# Patient Record
Sex: Female | Born: 1985 | Race: White | Hispanic: No | Marital: Single | Smoking: Former smoker
Health system: Southern US, Community
[De-identification: ages and names within clinical notes are randomized; demographics above are authoritative.]

## PROBLEM LIST (undated history)

## (undated) DIAGNOSIS — F329 Major depressive disorder, single episode, unspecified: Secondary | ICD-10-CM

## (undated) DIAGNOSIS — F32A Depression, unspecified: Secondary | ICD-10-CM

## (undated) DIAGNOSIS — R87629 Unspecified abnormal cytological findings in specimens from vagina: Secondary | ICD-10-CM

## (undated) DIAGNOSIS — I10 Essential (primary) hypertension: Secondary | ICD-10-CM

## (undated) DIAGNOSIS — F419 Anxiety disorder, unspecified: Secondary | ICD-10-CM

## (undated) HISTORY — PX: LEEP: SHX91

## (undated) HISTORY — PX: LASER ABLATION OF THE CERVIX: SHX1949

## (undated) HISTORY — DX: Unspecified abnormal cytological findings in specimens from vagina: R87.629

## (undated) HISTORY — DX: Essential (primary) hypertension: I10

---
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
## 2005-04-20 ENCOUNTER — Other Ambulatory Visit: Admission: RE | Admit: 2005-04-20 | Discharge: 2005-04-20 | Payer: Self-pay | Admitting: Obstetrics and Gynecology

## 2005-10-12 HISTORY — PX: DILATION AND CURETTAGE OF UTERUS: SHX78

## 2005-12-07 ENCOUNTER — Other Ambulatory Visit: Admission: RE | Admit: 2005-12-07 | Discharge: 2005-12-07 | Payer: Self-pay | Admitting: Obstetrics and Gynecology

## 2006-10-26 ENCOUNTER — Inpatient Hospital Stay (HOSPITAL_COMMUNITY): Admission: AD | Admit: 2006-10-26 | Discharge: 2006-10-26 | Payer: Self-pay | Admitting: Obstetrics and Gynecology

## 2006-10-27 ENCOUNTER — Inpatient Hospital Stay (HOSPITAL_COMMUNITY): Admission: AD | Admit: 2006-10-27 | Discharge: 2006-10-27 | Payer: Self-pay | Admitting: Obstetrics and Gynecology

## 2006-10-28 ENCOUNTER — Ambulatory Visit: Payer: Self-pay | Admitting: Obstetrics and Gynecology

## 2006-10-28 ENCOUNTER — Encounter (INDEPENDENT_AMBULATORY_CARE_PROVIDER_SITE_OTHER): Payer: Self-pay | Admitting: *Deleted

## 2006-10-28 ENCOUNTER — Ambulatory Visit (HOSPITAL_COMMUNITY): Admission: RE | Admit: 2006-10-28 | Discharge: 2006-10-28 | Payer: Self-pay | Admitting: Obstetrics and Gynecology

## 2009-12-17 ENCOUNTER — Emergency Department (HOSPITAL_COMMUNITY): Admission: EM | Admit: 2009-12-17 | Discharge: 2009-12-17 | Payer: Self-pay | Admitting: Emergency Medicine

## 2011-02-27 NOTE — Op Note (Signed)
NAME:  Allison Ewing, Allison Ewing               ACCOUNT NO.:  [id number]   MEDICAL RECORD NO.:  [id number]          PATIENT TYPE:  AMB   LOCATION:  SDC                           FACILITY:  [facility]   PHYSICIAN:  Phil D. Okey Dupre, M.D.     DATE OF BIRTH:  [birth year]   DATE OF PROCEDURE:  10/28/2006  DATE OF DISCHARGE:                               OPERATIVE REPORT   PREOPERATIVE DIAGNOSIS:  Missed abortion at approximately 8 weeks'  gestation.   POSTOPERATIVE DIAGNOSIS:  Missed abortion at approximately 8 weeks'  gestation.   PROCEDURE:  Suction, dilatation and evacuation.   SURGEON:  Javier Glazier. Okey Dupre, M.D.   ASSISTANT:  Paticia Stack, M.D.   ANESTHESIA:  General.   SPECIMENS:  Products of conception, sent to Pathology.   ESTIMATED BLOOD LOSS:  Minimal.   COMPLICATIONS:  None.   FINDINGS:  An 8-week-size anteverted uterus.  Moderate amounts of  products of conception.   PROCEDURE:  The patient was taken to the operating room after a missed  Ab was confirmed with ultrasound.  A sterile speculum was placed in the  patient's vagina and the cervix noted to be about 1-cm dilated.  The  cervix and vagina were swabbed with Betadine and a single-tooth  tenaculum was applied to the anterior lip of the cervix.  Ten  milliliters of 1% Xylocaine were injected at the 3 and 9 o'clock  position to produce the paracervical block.  The uterus was then sounded  to 8 cm and serial dilators to 8 Hegar were used.  An 8-mm suction  curette was then advanced gently to the uterine fundus.  The suction  device was then activated and the carriage rotated to clear the uterus  of the products of conception.  Three passes were made and the uterus  was cleared of all products of conception.  There was minimal bleeding  noted and the tenaculum removed with good hemostasis.  The patient  tolerated the procedure well and was taken to Recovery in stable  condition.     ______________________________  Paticia Stack,  MD    ______________________________  Javier Glazier Okey Dupre, M.D.   LNJ/MEDQ  D:  10/28/2006  T:  10/28/2006  Job:  [id number]

## 2011-02-27 NOTE — H&P (Signed)
NAME:  Allison Ewing, TWANNA RESH NO.:  [id number]   MEDICAL RECORD NO.:  [id number]                   PATIENT TYPE:  AMB   LOCATION:  SDC                                  FACILITY:  [facility]   PHYSICIAN:  Juluis Mire, M.D.                DATE OF BIRTH:  07/15/1986   DATE OF ADMISSION:  DATE OF DISCHARGE:                                HISTORY & PHYSICAL   The patient is a 25 year old, nulligravida, single white female who presents  for laser ablation of the cervix.   In relation to the present admission, the patient's last Pap smear did  reveal mild cervical dysplasia.  She underwent colposcopic evaluation.  She  had diffuse acetowhite areas with no __________ and punctation, covering the  active cervix with vaginal extension.  Biopsies were obtained that did  reveal mild cervical dysplasia.  Due to the widespread involvement of the  cervix along the vaginal extension, we are going to proceed with laser  ablation.   ALLERGIES:  In terms of allergies, no known drug allergies.   MEDICATIONS:  Include birth control pills.   PAST MEDICAL HISTORY:  Usual childhood diseases.  No significant sequela.   PAST SURGICAL HISTORY:  She has had no previous surgical or obstetrical  history.   FAMILY HISTORY:  There is a history of diabetes on her mother's side as well  as hypertension and heart disease.   SOCIAL HISTORY:  No tobacco or alcohol use.   REVIEW OF SYSTEMS:  Noncontributory.   PHYSICAL EXAMINATION:  VITAL SIGNS:  The patient is afebrile with stable  vital signs.  HEENT EXAM:  The patient is normocephalic.  Pupils equal, round and reactive  to light and accommodation.  Extraocular movements were intact.  Sclerae and  conjunctivae clear.  Oropharynx clear.  NECK:  Without thyromegaly.  BREASTS:  Not examined.  LUNGS:  Clear.  CARDIOVASCULAR SYSTEM:  Regular rate and rhythm with no murmurs or gallops.  ABDOMINAL EXAM:  Benign.  No masses, organomegaly or  tenderness.  PELVIC:  Normal external genitalia.  Vaginal mucosa is clear.  Cervix  unremarkable to gross examination.  Uterus is normal in size, shape and  contour.  Adnexa are free of masses or tenderness.  EXTREMITIES:  Trace edema.  NEUROLOGIC EXAM:  Grossly within normal limits.   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   PLAN:  The patient is to undergo laser ablation of the cervix.  Success  rates of 90% are quoted.  Potential risks of laser ablation include  hemorrhage that could potentially require the need for transfusion with  associated risk of  AIDS or hepatitis or retreatment.  In terms of future risks, there would be  the risk cervical stenosis that could inhibit the ability to conceive.  Also, risk of cervical incompetency requiring management in future  pregnancy.  The patient expressed  an understanding of the indications, risks  and limitations of the procedure.                                               Juluis Mire, M.D.    JSM/MEDQ  D:  03/28/2003  T:  03/28/2003  Job:  [id number]

## 2011-02-27 NOTE — Op Note (Signed)
   NAME:  Allison Ewing, Allison Ewing                         ACCOUNT NO.:  [id number]   MEDICAL RECORD NO.:  [id number]                   PATIENT TYPE:  AMB   LOCATION:  SDC                                  [facility]ILITY:  [facility]   PHYSICIAN:  Juluis Mire, M.D.                DATE OF BIRTH:  [birth year]   DATE OF PROCEDURE:  03/28/2003  DATE OF DISCHARGE:                                 OPERATIVE REPORT   PREOPERATIVE DIAGNOSIS:  Mild cervical dysplasia with widespread involvement  of the cervix, with vaginal extension.   POSTOPERATIVE DIAGNOSIS:  Mild cervical dysplasia with widespread  involvement of the cervix, with vaginal extension.   PROCEDURE:  Laser ablation of the cervix and vaginal extension of apparent  dysplasia.   SURGEON:  Juluis Mire, M.D.   ANESTHESIA:  General.   ESTIMATED BLOOD LOSS:  Minimal.   PACKS AND DRAINS:  None.   INTRAOPERATIVE BLOOD REPLACED:  None.   COMPLICATIONS:  None.   INDICATIONS:  Indications are dictated in the history and physical.   PROCEDURE:  The patient was taken to the OR and placed in the supine  position.  After a satisfactory level of general anesthesia obtained, the  patient was placed in the dorsal lithotomy position using the Allen  stirrups.  The patient was draped.  A speculum was placed in the vaginal  vault.  The cervix was cleansed with acetic acid.  Colposcopic evaluation  did reveal widespread acetowhite epithelium with abnormal vasculature  consistent with mild dysplasia.  Using the CO2 laser at 20 watts continuous  mode, we ablated the areas of obvious dysplasia to an appropriate depth.  All four quadrants were adequately ablated.  We then ablated the vaginal  extension but much more superficially.  At the end of the procedure all  active areas of cervical dysplasia had been ablated with the CO2 laser to an  adequate depth.  No active bleeding or complications were encountered.  Silvadene cream was placed on the cervix.  The  speculum was then removed.  The patient was taken out of the dorsal lithotomy position and once alert  and extubated transferred to the recovery room in good condition.  Sponge,  instrument, and needle count reported as correct by the circulating nurse.                                                Juluis Mire, M.D.   JSM/MEDQ  D:  03/28/2003  T:  03/28/2003  Job:  [id number]

## 2011-09-17 ENCOUNTER — Encounter: Payer: Self-pay | Admitting: Emergency Medicine

## 2011-09-17 ENCOUNTER — Emergency Department (HOSPITAL_COMMUNITY)
Admission: EM | Admit: 2011-09-17 | Discharge: 2011-09-18 | Disposition: A | Discharge status: Home / Self Care | Payer: Commercial Managed Care - PPO | Attending: Emergency Medicine | Admitting: Emergency Medicine

## 2011-09-17 DIAGNOSIS — R42 Dizziness and giddiness: Secondary | ICD-10-CM | POA: Insufficient documentation

## 2011-09-17 DIAGNOSIS — R002 Palpitations: Secondary | ICD-10-CM | POA: Insufficient documentation

## 2011-09-17 DIAGNOSIS — F419 Anxiety disorder, unspecified: Secondary | ICD-10-CM

## 2011-09-17 DIAGNOSIS — F411 Generalized anxiety disorder: Secondary | ICD-10-CM | POA: Insufficient documentation

## 2011-09-17 DIAGNOSIS — R0602 Shortness of breath: Secondary | ICD-10-CM | POA: Insufficient documentation

## 2011-09-17 HISTORY — DX: Anxiety disorder, unspecified: F41.9

## 2011-09-17 NOTE — ED Notes (Signed)
Pt alert, nad, c/o anxiety, recently seen and treated, started on Lexapro, resp even unlabored, skin pwd, denies SI/HI

## 2011-09-18 NOTE — ED Provider Notes (Signed)
Medical screening examination/treatment/procedure(s) were performed by non-physician practitioner and as supervising physician I was immediately available for consultation/collaboration.  Gervase Colberg K Hargis Vandyne-Rasch, MD 09/18/11 2359 

## 2011-09-18 NOTE — ED Notes (Signed)
Pt with c/o heart palpitations, dizziness, headaches, SOB, near syncope, weakness, nausea, disorientation, and diaphoresis since 2000. Pt states that she took Lexapro today around 1600 for the first time as it was prescribed by her doctor for anxiety. Pt states the Lexapro didn't help her symptoms. It made them worse. Pt in no distress at this time. Respirations even and unlabored.

## 2011-09-18 NOTE — ED Provider Notes (Signed)
History     CSN: [id number] Arrival date & time: 09/17/2011 11:13 PM   First MD Initiated Contact with Patient 09/18/11 0200      Chief Complaint  Patient presents with  . Anxiety    HPI  History provided to patient and mother. Patient presents with complaints of "possible panic attack" earlier this evening. She states that she has hx of some anxiety and "palpitations" for which she was just recently Rx Lexapro by her PCP. Patient took her first dose last evening around 6 to 7 PM.  Shortly after that time she began to have worsening symptoms of heart palpitations, rapid breathing, lightheadedness, and feeling "jittery".  Symptoms lasted about 3-4 hours and then slowly began to improve.  Pt states she was worried she may be having a reaction to the Lexapro.  She denies having rash, pruritis, throat to neck swelling, nausea or vomiting.  Pt has no other significant PMH.     Past Medical History  Diagnosis Date  . Anxiety     History reviewed. No pertinent past surgical history.  No family history on file.  History  Substance Use Topics  . Smoking status: Current Everyday Smoker -- 1.0 packs/day  . Smokeless tobacco: Not on file  . Alcohol Use: Yes    OB History    Grav Para Term Preterm Abortions TAB SAB Ect Mult Living                  Review of Systems  Constitutional: Positive for diaphoresis. Negative for fever and chills.  Respiratory: Positive for shortness of breath. Negative for cough and wheezing.   Cardiovascular: Positive for palpitations. Negative for chest pain.  Gastrointestinal: Negative for nausea, vomiting, abdominal pain, diarrhea and constipation.  Neurological: Positive for light-headedness. Negative for weakness.  Psychiatric/Behavioral: Negative for confusion.  All other systems reviewed and are negative.    Allergies  Review of patient's allergies indicates no known allergies.  Home Medications   Current Outpatient Rx  Name Route Sig  Dispense Refill  . ESCITALOPRAM OXALATE 10 MG PO TABS Oral Take 10 mg by mouth daily.      Marland Kitchen FLINTSTONES COMPLETE PO Oral Take 1 tablet by mouth daily.        BP 125/76  Pulse 82  Temp 98 F (36.7 C)  Resp 16  Wt 110 lb (49.896 kg)  SpO2 100%  LMP 08/25/2011  Physical Exam  Nursing note and vitals reviewed. Constitutional: She is oriented to person, place, and time. She appears well-developed and well-nourished. No distress.  HENT:  Head: Normocephalic.  Mouth/Throat: Oropharynx is clear and moist.  Eyes: Conjunctivae and EOM are normal. Pupils are equal, round, and reactive to light.  Neck: Normal range of motion. Neck supple.  Cardiovascular: Normal rate, regular rhythm and normal heart sounds.   Pulmonary/Chest: Effort normal and breath sounds normal. No respiratory distress. She has no wheezes. She has no rales.  Abdominal: Soft.  Musculoskeletal: She exhibits no edema and no tenderness.  Neurological: She is alert and oriented to person, place, and time. No cranial nerve deficit.  Skin: Skin is warm. No rash noted.  Psychiatric: She has a normal mood and affect. Her behavior is normal.    ED Course  Procedures (including critical care time)   1. Anxiety      MDM  Pt seen and evaluated. Patient in no acute distress. Patient with relief of symptoms at this time.        Phill Mutter  Garron Eline, [place] 09/18/11 [phone]

## 2013-12-28 LAB — OB RESULTS CONSOLE ANTIBODY SCREEN: ANTIBODY SCREEN: NEGATIVE

## 2013-12-28 LAB — OB RESULTS CONSOLE RPR: RPR: NONREACTIVE

## 2013-12-28 LAB — OB RESULTS CONSOLE RUBELLA ANTIBODY, IGM: RUBELLA: NON-IMMUNE/NOT IMMUNE

## 2013-12-28 LAB — OB RESULTS CONSOLE HIV ANTIBODY (ROUTINE TESTING): HIV: NONREACTIVE

## 2013-12-28 LAB — OB RESULTS CONSOLE ABO/RH: RH TYPE: POSITIVE

## 2013-12-28 LAB — OB RESULTS CONSOLE HEPATITIS B SURFACE ANTIGEN: HEP B S AG: NEGATIVE

## 2014-07-31 ENCOUNTER — Telehealth (HOSPITAL_COMMUNITY): Payer: Self-pay | Admitting: *Deleted

## 2014-07-31 ENCOUNTER — Encounter (HOSPITAL_COMMUNITY): Payer: Self-pay | Admitting: *Deleted

## 2014-07-31 LAB — OB RESULTS CONSOLE GBS: GBS: NEGATIVE

## 2014-07-31 NOTE — Telephone Encounter (Signed)
Preadmission screen  

## 2014-08-05 ENCOUNTER — Inpatient Hospital Stay (HOSPITAL_COMMUNITY): Payer: PRIVATE HEALTH INSURANCE | Admitting: Anesthesiology

## 2014-08-05 ENCOUNTER — Encounter (HOSPITAL_COMMUNITY): Payer: Self-pay | Admitting: *Deleted

## 2014-08-05 ENCOUNTER — Encounter (HOSPITAL_COMMUNITY): Payer: PRIVATE HEALTH INSURANCE | Admitting: Anesthesiology

## 2014-08-05 ENCOUNTER — Inpatient Hospital Stay (HOSPITAL_COMMUNITY)
Admission: AD | Admit: 2014-08-05 | Discharge: 2014-08-08 | DRG: 766 | Disposition: A | Discharge status: Home / Self Care | Payer: PRIVATE HEALTH INSURANCE | Source: Ambulatory Visit | Attending: Obstetrics & Gynecology | Admitting: Obstetrics & Gynecology

## 2014-08-05 ENCOUNTER — Inpatient Hospital Stay (HOSPITAL_COMMUNITY)
Admission: AD | Admit: 2014-08-05 | Discharge: 2014-08-05 | Disposition: A | Discharge status: Home / Self Care | Payer: PRIVATE HEALTH INSURANCE | Source: Ambulatory Visit | Attending: Obstetrics & Gynecology | Admitting: Obstetrics & Gynecology

## 2014-08-05 DIAGNOSIS — O471 False labor at or after 37 completed weeks of gestation: Secondary | ICD-10-CM | POA: Insufficient documentation

## 2014-08-05 DIAGNOSIS — F419 Anxiety disorder, unspecified: Secondary | ICD-10-CM | POA: Diagnosis present

## 2014-08-05 DIAGNOSIS — O480 Post-term pregnancy: Secondary | ICD-10-CM | POA: Diagnosis present

## 2014-08-05 DIAGNOSIS — Z87891 Personal history of nicotine dependence: Secondary | ICD-10-CM

## 2014-08-05 DIAGNOSIS — O99344 Other mental disorders complicating childbirth: Secondary | ICD-10-CM | POA: Diagnosis present

## 2014-08-05 DIAGNOSIS — Z833 Family history of diabetes mellitus: Secondary | ICD-10-CM

## 2014-08-05 DIAGNOSIS — Z823 Family history of stroke: Secondary | ICD-10-CM

## 2014-08-05 DIAGNOSIS — Z3A41 41 weeks gestation of pregnancy: Secondary | ICD-10-CM | POA: Diagnosis present

## 2014-08-05 DIAGNOSIS — Z3A40 40 weeks gestation of pregnancy: Secondary | ICD-10-CM | POA: Diagnosis not present

## 2014-08-05 DIAGNOSIS — IMO0001 Reserved for inherently not codable concepts without codable children: Secondary | ICD-10-CM

## 2014-08-05 DIAGNOSIS — Z8249 Family history of ischemic heart disease and other diseases of the circulatory system: Secondary | ICD-10-CM

## 2014-08-05 DIAGNOSIS — Z98891 History of uterine scar from previous surgery: Secondary | ICD-10-CM

## 2014-08-05 LAB — TYPE AND SCREEN
ABO/RH(D): A POS
Antibody Screen: NEGATIVE

## 2014-08-05 LAB — CBC
HCT: 32 % — ABNORMAL LOW (ref 36.0–46.0)
Hemoglobin: 10.9 g/dL — ABNORMAL LOW (ref 12.0–15.0)
MCH: 31.4 pg (ref 26.0–34.0)
MCHC: 34.1 g/dL (ref 30.0–36.0)
MCV: 92.2 fL (ref 78.0–100.0)
Platelets: 177 10*3/uL (ref 150–400)
RBC: 3.47 MIL/uL — ABNORMAL LOW (ref 3.87–5.11)
RDW: 13.8 % (ref 11.5–15.5)
WBC: 9.9 10*3/uL (ref 4.0–10.5)

## 2014-08-05 LAB — RPR

## 2014-08-05 LAB — OB RESULTS CONSOLE GC/CHLAMYDIA
Chlamydia: NEGATIVE
Gonorrhea: NEGATIVE

## 2014-08-05 MED ORDER — LIDOCAINE HCL (PF) 1 % IJ SOLN: 30.0000 mL | INTRAMUSCULAR | Status: DC | PRN

## 2014-08-05 MED ORDER — OXYCODONE-ACETAMINOPHEN 5-325 MG PO TABS: 1.0000 | ORAL_TABLET | ORAL | Status: DC | PRN

## 2014-08-05 MED ORDER — FENTANYL 2.5 MCG/ML BUPIVACAINE 1/10 % EPIDURAL INFUSION (WH - ANES)
INTRAMUSCULAR | Status: AC
  Administered 2014-08-05: 14 mL/h via EPIDURAL
  Filled 2014-08-05: qty 125

## 2014-08-05 MED ORDER — EPHEDRINE 5 MG/ML INJ: 10.0000 mg | INTRAVENOUS | Status: DC | PRN

## 2014-08-05 MED ORDER — OXYTOCIN BOLUS FROM INFUSION: 500.0000 mL | INTRAVENOUS | Status: DC

## 2014-08-05 MED ORDER — ONDANSETRON HCL 4 MG/2ML IJ SOLN: 4.0000 mg | Freq: Four times a day (QID) | INTRAMUSCULAR | Status: DC | PRN

## 2014-08-05 MED ORDER — TERBUTALINE SULFATE 1 MG/ML IJ SOLN: 0.2500 mg | Freq: Once | INTRAMUSCULAR | Status: AC | PRN

## 2014-08-05 MED ORDER — OXYCODONE-ACETAMINOPHEN 5-325 MG PO TABS: 2.0000 | ORAL_TABLET | ORAL | Status: DC | PRN

## 2014-08-05 MED ORDER — PHENYLEPHRINE 40 MCG/ML (10ML) SYRINGE FOR IV PUSH (FOR BLOOD PRESSURE SUPPORT)
PREFILLED_SYRINGE | INTRAVENOUS | Status: AC
  Filled 2014-08-05: qty 10

## 2014-08-05 MED ORDER — OXYTOCIN 40 UNITS IN LACTATED RINGERS INFUSION - SIMPLE MED: 62.5000 mL/h | INTRAVENOUS | Status: DC

## 2014-08-05 MED ORDER — CITRIC ACID-SODIUM CITRATE 334-500 MG/5ML PO SOLN
30.0000 mL | ORAL | Status: DC | PRN
  Administered 2014-08-06: 30 mL via ORAL
  Filled 2014-08-05 (×2): qty 15

## 2014-08-05 MED ORDER — FENTANYL 2.5 MCG/ML BUPIVACAINE 1/10 % EPIDURAL INFUSION (WH - ANES)
14.0000 mL/h | INTRAMUSCULAR | Status: DC | PRN
  Administered 2014-08-05 (×2): 14 mL/h via EPIDURAL
  Filled 2014-08-05: qty 125

## 2014-08-05 MED ORDER — ACETAMINOPHEN 325 MG PO TABS: 650.0000 mg | ORAL_TABLET | ORAL | Status: DC | PRN

## 2014-08-05 MED ORDER — DIPHENHYDRAMINE HCL 50 MG/ML IJ SOLN: 12.5000 mg | INTRAMUSCULAR | Status: DC | PRN

## 2014-08-05 MED ORDER — PHENYLEPHRINE 40 MCG/ML (10ML) SYRINGE FOR IV PUSH (FOR BLOOD PRESSURE SUPPORT): 80.0000 ug | PREFILLED_SYRINGE | INTRAVENOUS | Status: DC | PRN

## 2014-08-05 MED ORDER — LACTATED RINGERS IV SOLN: 500.0000 mL | Freq: Once | INTRAVENOUS | Status: DC

## 2014-08-05 MED ORDER — LACTATED RINGERS IV SOLN: 500.0000 mL | INTRAVENOUS | Status: DC | PRN

## 2014-08-05 MED ORDER — OXYTOCIN 40 UNITS IN LACTATED RINGERS INFUSION - SIMPLE MED
1.0000 m[IU]/min | INTRAVENOUS | Status: DC
  Administered 2014-08-05: 2 m[IU]/min via INTRAVENOUS
  Filled 2014-08-05: qty 1000

## 2014-08-05 MED ORDER — LACTATED RINGERS IV SOLN
INTRAVENOUS | Status: DC
  Administered 2014-08-05 (×2): via INTRAVENOUS

## 2014-08-05 MED ORDER — FLEET ENEMA 7-19 GM/118ML RE ENEM: 1.0000 | ENEMA | RECTAL | Status: DC | PRN

## 2014-08-05 MED ORDER — PHENYLEPHRINE 40 MCG/ML (10ML) SYRINGE FOR IV PUSH (FOR BLOOD PRESSURE SUPPORT)
80.0000 ug | PREFILLED_SYRINGE | INTRAVENOUS | Status: DC | PRN
  Administered 2014-08-06: 80 ug via INTRAVENOUS

## 2014-08-05 MED ORDER — LIDOCAINE HCL (PF) 1 % IJ SOLN
INTRAMUSCULAR | Status: DC | PRN
  Administered 2014-08-05 – 2014-08-06 (×4): 5 mL

## 2014-08-05 NOTE — Progress Notes (Signed)
PT sitting on bedside 

## 2014-08-05 NOTE — MAU Note (Signed)
C/o ics since 0330 this AM; denies SROM;

## 2014-08-05 NOTE — H&P (Signed)
Allison Judie PetitM Uvaldo RisingMcNeil is a 28 y.o. female presenting for labor.  She has been contracting for greater than 24 hours with increasing intensity.  No LOF, VB.  Active FM.  Antepartum course uncomplicated except by anxiety.  GBS negative.  Comfortable with epidural.  Maternal Medical History:  Reason for admission: Contractions.   Contractions: Onset was 13-24 hours ago.   Frequency: regular.   Perceived severity is moderate.    Fetal activity: Perceived fetal activity is normal.   Last perceived fetal movement was within the past hour.    Prenatal complications: no prenatal complications Prenatal Complications - Diabetes: none.    OB History   Grav Para Term Preterm Abortions TAB SAB Ect Mult Living   2 0   1  1        Past Medical History  Diagnosis Date  . Anxiety   . Vaginal Pap smear, abnormal    Past Surgical History  Procedure Laterality Date  . Leep    . Laser ablation of the cervix     Family History: family history includes Cancer in her maternal grandfather and paternal grandmother; Diabetes in her maternal aunt; Hypertension in her maternal grandmother and paternal grandmother; Stroke in her maternal grandmother. Social History:  reports that she has quit smoking. Her smoking use included Cigarettes. She smoked 1.00 pack per day. She has never used smokeless tobacco. She reports that she drinks alcohol. She reports that she does not use illicit drugs.   Prenatal Transfer Tool  Maternal Diabetes: No Genetic Screening: Normal Maternal Ultrasounds/Referrals: Normal Fetal Ultrasounds or other Referrals:  None Maternal Substance Abuse:  No Significant Maternal Medications:  None Significant Maternal Lab Results:  Lab values include: Group B Strep negative Other Comments:  None  ROS  Dilation: 5 Effacement (%): 100 Station: -3 Exam by:: Elana AlmElizabeth [facility] Blood pressure 136/80, pulse 101, temperature 97.7 F (36.5 C), temperature source Axillary, resp. rate 20, height  5' 3.5" (1.613 m), weight 157 lb (71.215 kg), last menstrual period 10/23/2013, SpO2 99.00%. Maternal Exam:  Uterine Assessment: Contraction strength is moderate.  Contraction frequency is regular.   Abdomen: Patient reports no abdominal tenderness. Fundal height is c/w dates.   Estimated fetal weight is 8#.       Physical Exam  Constitutional: She is oriented to person, place, and time. She appears well-developed and well-nourished.  GI: Soft. There is no rebound and no guarding.  Neurological: She is alert and oriented to person, place, and time.  Skin: Skin is warm and dry.  Psychiatric: She has a normal mood and affect. Her behavior is normal.    Prenatal labs: ABO, Rh: --/--/A POS (10/25 1432) Antibody: NEG (10/25 1432) Rubella: Nonimmune (03/19 0000) RPR: Nonreactive (03/19 0000)  HBsAg: Negative (03/19 0000)  HIV: Non-reactive (03/19 0000)  GBS: Negative (10/20 0000)   Assessment/Plan: 28yo G2P0010 at 9430w6d with labor -Expect SVD    Ermon Sagan 08/05/2014, 6:14 PM

## 2014-08-05 NOTE — Discharge Instructions (Signed)
Braxton Hicks Contractions °Contractions of the uterus can occur throughout pregnancy. Contractions are not always a sign that you are in labor.  °WHAT ARE BRAXTON HICKS CONTRACTIONS?  °Contractions that occur before labor are called Braxton Hicks contractions, or false labor. Toward the end of pregnancy (32-34 weeks), these contractions can develop more often and may become more forceful. This is not true labor because these contractions do not result in opening (dilatation) and thinning of the cervix. They are sometimes difficult to tell apart from true labor because these contractions can be forceful and people have different pain tolerances. You should not feel embarrassed if you go to the hospital with false labor. Sometimes, the only way to tell if you are in true labor is for your health care provider to look for changes in the cervix. °If there are no prenatal problems or other health problems associated with the pregnancy, it is completely safe to be sent home with false labor and await the onset of true labor. °HOW CAN YOU TELL THE DIFFERENCE BETWEEN TRUE AND FALSE LABOR? °False Labor °· The contractions of false labor are usually shorter and not as hard as those of true labor.   °· The contractions are usually irregular.   °· The contractions are often felt in the front of the lower abdomen and in the groin.   °· The contractions may go away when you walk around or change positions while lying down.   °· The contractions get weaker and are shorter lasting as time goes on.   °· The contractions do not usually become progressively stronger, regular, and closer together as with true labor.   °True Labor °· Contractions in true labor last 30-70 seconds, become very regular, usually become more intense, and increase in frequency.   °· The contractions do not go away with walking.   °· The discomfort is usually felt in the top of the uterus and spreads to the lower abdomen and low back.   °· True labor can be  determined by your health care provider with an exam. This will show that the cervix is dilating and getting thinner.   °WHAT TO REMEMBER °· Keep up with your usual exercises and follow other instructions given by your health care provider.   °· Take medicines as directed by your health care provider.   °· Keep your regular prenatal appointments.   °· Eat and drink lightly if you think you are going into labor.   °· If Braxton Hicks contractions are making you uncomfortable:   °¨ Change your position from lying down or resting to walking, or from walking to resting.   °¨ Sit and rest in a tub of warm water.   °¨ Drink 2-3 glasses of water. Dehydration may cause these contractions.   °¨ Do slow and deep breathing several times an hour.   °WHEN SHOULD I SEEK IMMEDIATE MEDICAL CARE? °Seek immediate medical care if: °· Your contractions become stronger, more regular, and closer together.   °· You have fluid leaking or gushing from your vagina.   °· You have a fever.   °· You pass blood-tinged mucus.   °· You have vaginal bleeding.   °· You have continuous abdominal pain.   °· You have low back pain that you never had before.   °· You feel your baby's head pushing down and causing pelvic pressure.   °· Your baby is not moving as much as it used to.   °Document Released: 09/28/2005 Document Revised: 10/03/2013 Document Reviewed: 07/10/2013 °[employer]® Patient Information ©2015 [employer]. This information is not intended to replace advice given to you by your health care   provider. Make sure you discuss any questions you have with your health care provider. ° °

## 2014-08-05 NOTE — MAU Note (Signed)
Contractions since 0330. Some bloody show. Denies LOF

## 2014-08-05 NOTE — Anesthesia Preprocedure Evaluation (Signed)

## 2014-08-05 NOTE — Progress Notes (Signed)
Dr Langston MaskerMorris notified of pt's admission and status. Aware of ctx pattern, sve, unable to feel pp. Pt scheduled for induction 1930 on Monday.

## 2014-08-05 NOTE — Anesthesia Procedure Notes (Signed)
Epidural Patient location during procedure: OB Start time: 08/05/2014 3:42 PM  Staffing Anesthesiologist: Brayton CavesJACKSON, Ranald Alessio Performed by: anesthesiologist   Preanesthetic Checklist Completed: patient identified, site marked, surgical consent, pre-op evaluation, timeout performed, IV checked, risks and benefits discussed and monitors and equipment checked  Epidural Patient position: sitting Prep: site prepped and draped and DuraPrep Patient monitoring: continuous pulse ox and blood pressure Approach: midline Location: L3-L4 Injection technique: LOR air  Needle:  Needle type: Tuohy  Needle gauge: 17 G Needle length: 9 cm and 9 Needle insertion depth: 5 cm cm Catheter type: closed end flexible Catheter size: 19 Gauge Catheter at skin depth: 10 cm Test dose: negative  Assessment Events: blood not aspirated, injection not painful, no injection resistance, negative IV test and no paresthesia  Additional Notes Patient identified.  Risk benefits discussed including failed block, incomplete pain control, headache, nerve damage, paralysis, blood pressure changes, nausea, vomiting, reactions to medication both toxic or allergic, and postpartum back pain.  Patient expressed understanding and wished to proceed.  All questions were answered.  Sterile technique used throughout procedure and epidural site dressed with sterile barrier dressing. No paresthesia or other complications noted.The patient did not experience any signs of intravascular injection such as tinnitus or metallic taste in mouth nor signs of intrathecal spread such as rapid motor block. Please see nursing notes for vital signs.

## 2014-08-05 NOTE — Progress Notes (Signed)
Written and verbal d/c instructions given for labor precautions and understanding voiced.

## 2014-08-06 ENCOUNTER — Encounter (HOSPITAL_COMMUNITY): Payer: Self-pay | Admitting: *Deleted

## 2014-08-06 ENCOUNTER — Encounter (HOSPITAL_COMMUNITY)
Admission: AD | Disposition: A | Discharge status: Home / Self Care | Payer: Self-pay | Source: Ambulatory Visit | Attending: Obstetrics & Gynecology

## 2014-08-06 ENCOUNTER — Inpatient Hospital Stay (HOSPITAL_COMMUNITY): Admission: RE | Admit: 2014-08-06 | Payer: Commercial Managed Care - PPO | Source: Ambulatory Visit

## 2014-08-06 DIAGNOSIS — Z98891 History of uterine scar from previous surgery: Secondary | ICD-10-CM

## 2014-08-06 SURGERY — Surgical Case
Anesthesia: Epidural | Site: Abdomen

## 2014-08-06 MED ORDER — SERTRALINE HCL 50 MG PO TABS
50.0000 mg | ORAL_TABLET | Freq: Every day | ORAL | Status: DC
  Administered 2014-08-06 – 2014-08-07 (×2): 50 mg via ORAL
  Filled 2014-08-06 (×4): qty 1

## 2014-08-06 MED ORDER — LANOLIN HYDROUS EX OINT: 1.0000 "application " | TOPICAL_OINTMENT | CUTANEOUS | Status: DC | PRN

## 2014-08-06 MED ORDER — TETANUS-DIPHTH-ACELL PERTUSSIS 5-2.5-18.5 LF-MCG/0.5 IM SUSP: 0.5000 mL | Freq: Once | INTRAMUSCULAR | Status: DC

## 2014-08-06 MED ORDER — PRENATAL MULTIVITAMIN CH
1.0000 | ORAL_TABLET | Freq: Every day | ORAL | Status: DC
  Administered 2014-08-06 – 2014-08-08 (×3): 1 via ORAL
  Filled 2014-08-06 (×2): qty 1

## 2014-08-06 MED ORDER — SCOPOLAMINE 1 MG/3DAYS TD PT72
1.0000 | MEDICATED_PATCH | Freq: Once | TRANSDERMAL | Status: DC
  Filled 2014-08-06: qty 1

## 2014-08-06 MED ORDER — SCOPOLAMINE 1 MG/3DAYS TD PT72
MEDICATED_PATCH | TRANSDERMAL | Status: DC | PRN
  Administered 2014-08-06: 1 via TRANSDERMAL

## 2014-08-06 MED ORDER — WITCH HAZEL-GLYCERIN EX PADS: 1.0000 "application " | MEDICATED_PAD | CUTANEOUS | Status: DC | PRN

## 2014-08-06 MED ORDER — MORPHINE SULFATE 0.5 MG/ML IJ SOLN
INTRAMUSCULAR | Status: AC
  Filled 2014-08-06: qty 10

## 2014-08-06 MED ORDER — DIPHENHYDRAMINE HCL 25 MG PO CAPS: 25.0000 mg | ORAL_CAPSULE | Freq: Four times a day (QID) | ORAL | Status: DC | PRN

## 2014-08-06 MED ORDER — KETOROLAC TROMETHAMINE 30 MG/ML IJ SOLN: 30.0000 mg | Freq: Four times a day (QID) | INTRAMUSCULAR | Status: DC | PRN

## 2014-08-06 MED ORDER — KETOROLAC TROMETHAMINE 30 MG/ML IJ SOLN
30.0000 mg | Freq: Four times a day (QID) | INTRAMUSCULAR | Status: DC | PRN
  Administered 2014-08-06: 30 mg via INTRAMUSCULAR

## 2014-08-06 MED ORDER — LACTATED RINGERS IV SOLN
INTRAVENOUS | Status: DC | PRN
  Administered 2014-08-06: 05:00:00 via INTRAVENOUS

## 2014-08-06 MED ORDER — MEASLES, MUMPS & RUBELLA VAC ~~LOC~~ INJ
0.5000 mL | INJECTION | Freq: Once | SUBCUTANEOUS | Status: DC
  Filled 2014-08-06: qty 0.5

## 2014-08-06 MED ORDER — ONDANSETRON HCL 4 MG/2ML IJ SOLN: 4.0000 mg | Freq: Three times a day (TID) | INTRAMUSCULAR | Status: DC | PRN

## 2014-08-06 MED ORDER — ONDANSETRON HCL 4 MG PO TABS: 4.0000 mg | ORAL_TABLET | ORAL | Status: DC | PRN

## 2014-08-06 MED ORDER — ONDANSETRON HCL 4 MG/2ML IJ SOLN: 4.0000 mg | INTRAMUSCULAR | Status: DC | PRN

## 2014-08-06 MED ORDER — DIPHENHYDRAMINE HCL 25 MG PO CAPS: 25.0000 mg | ORAL_CAPSULE | ORAL | Status: DC | PRN

## 2014-08-06 MED ORDER — FENTANYL CITRATE 0.05 MG/ML IJ SOLN
25.0000 ug | INTRAMUSCULAR | Status: DC | PRN
  Administered 2014-08-06: 25 ug via INTRAVENOUS

## 2014-08-06 MED ORDER — NALBUPHINE HCL 10 MG/ML IJ SOLN
5.0000 mg | INTRAMUSCULAR | Status: DC | PRN
  Administered 2014-08-06 (×2): via SUBCUTANEOUS
  Filled 2014-08-06 (×2): qty 1

## 2014-08-06 MED ORDER — MEPERIDINE HCL 25 MG/ML IJ SOLN: 6.2500 mg | INTRAMUSCULAR | Status: DC | PRN

## 2014-08-06 MED ORDER — SIMETHICONE 80 MG PO CHEW
80.0000 mg | CHEWABLE_TABLET | ORAL | Status: DC
  Administered 2014-08-07 – 2014-08-08 (×2): 80 mg via ORAL
  Filled 2014-08-06 (×2): qty 1

## 2014-08-06 MED ORDER — NALBUPHINE HCL 10 MG/ML IJ SOLN
5.0000 mg | Freq: Once | INTRAMUSCULAR | Status: AC | PRN
  Administered 2014-08-06: 5 mg via SUBCUTANEOUS

## 2014-08-06 MED ORDER — NALBUPHINE HCL 10 MG/ML IJ SOLN: 5.0000 mg | INTRAMUSCULAR | Status: DC | PRN

## 2014-08-06 MED ORDER — NALBUPHINE HCL 10 MG/ML IJ SOLN: 5.0000 mg | Freq: Once | INTRAMUSCULAR | Status: AC | PRN

## 2014-08-06 MED ORDER — MEPERIDINE HCL 25 MG/ML IJ SOLN
INTRAMUSCULAR | Status: AC
  Filled 2014-08-06: qty 1

## 2014-08-06 MED ORDER — SENNOSIDES-DOCUSATE SODIUM 8.6-50 MG PO TABS
2.0000 | ORAL_TABLET | ORAL | Status: DC
  Administered 2014-08-07 – 2014-08-08 (×2): 2 via ORAL
  Filled 2014-08-06 (×2): qty 2

## 2014-08-06 MED ORDER — DIPHENHYDRAMINE HCL 50 MG/ML IJ SOLN
INTRAMUSCULAR | Status: AC
  Filled 2014-08-06: qty 1

## 2014-08-06 MED ORDER — KETOROLAC TROMETHAMINE 30 MG/ML IJ SOLN
INTRAMUSCULAR | Status: AC
  Administered 2014-08-06: 30 mg via INTRAMUSCULAR
  Filled 2014-08-06: qty 1

## 2014-08-06 MED ORDER — KETOROLAC TROMETHAMINE 30 MG/ML IJ SOLN: 30.0000 mg | Freq: Four times a day (QID) | INTRAMUSCULAR | Status: AC | PRN

## 2014-08-06 MED ORDER — LACTATED RINGERS IV SOLN: INTRAVENOUS | Status: DC

## 2014-08-06 MED ORDER — MORPHINE SULFATE (PF) 0.5 MG/ML IJ SOLN
INTRAMUSCULAR | Status: DC | PRN
  Administered 2014-08-06: .5 mg via EPIDURAL
  Administered 2014-08-06: .5 mg via INTRAVENOUS

## 2014-08-06 MED ORDER — ONDANSETRON HCL 4 MG/2ML IJ SOLN
INTRAMUSCULAR | Status: DC | PRN
  Administered 2014-08-06: 4 mg via INTRAVENOUS

## 2014-08-06 MED ORDER — DIPHENHYDRAMINE HCL 50 MG/ML IJ SOLN: 12.5000 mg | INTRAMUSCULAR | Status: DC | PRN

## 2014-08-06 MED ORDER — MEPERIDINE HCL 25 MG/ML IJ SOLN
INTRAMUSCULAR | Status: DC | PRN
  Administered 2014-08-06 (×2): 12.5 mg via INTRAVENOUS

## 2014-08-06 MED ORDER — PHENYLEPHRINE 40 MCG/ML (10ML) SYRINGE FOR IV PUSH (FOR BLOOD PRESSURE SUPPORT)
PREFILLED_SYRINGE | INTRAVENOUS | Status: AC
  Filled 2014-08-06: qty 5

## 2014-08-06 MED ORDER — OXYCODONE-ACETAMINOPHEN 5-325 MG PO TABS
1.0000 | ORAL_TABLET | ORAL | Status: DC | PRN
Start: 2014-08-06 — End: 2014-08-08
  Administered 2014-08-07 – 2014-08-08 (×3): 1 via ORAL
  Filled 2014-08-06 (×3): qty 1

## 2014-08-06 MED ORDER — NALOXONE HCL 0.4 MG/ML IJ SOLN: 0.4000 mg | INTRAMUSCULAR | Status: DC | PRN

## 2014-08-06 MED ORDER — OXYTOCIN 40 UNITS IN LACTATED RINGERS INFUSION - SIMPLE MED
62.5000 mL/h | INTRAVENOUS | Status: AC
  Administered 2014-08-06: 62.5 mL/h via INTRAVENOUS

## 2014-08-06 MED ORDER — NALBUPHINE HCL 10 MG/ML IJ SOLN: 5.0000 mg | Freq: Once | INTRAMUSCULAR | Status: DC | PRN

## 2014-08-06 MED ORDER — SODIUM CHLORIDE 0.9 % IJ SOLN: 3.0000 mL | INTRAMUSCULAR | Status: DC | PRN

## 2014-08-06 MED ORDER — OXYTOCIN 10 UNIT/ML IJ SOLN
40.0000 [IU] | INTRAVENOUS | Status: DC | PRN
  Administered 2014-08-06: 40 [IU] via INTRAVENOUS

## 2014-08-06 MED ORDER — DIPHENHYDRAMINE HCL 50 MG/ML IJ SOLN
INTRAMUSCULAR | Status: DC | PRN
  Administered 2014-08-06: 25 mg via INTRAVENOUS

## 2014-08-06 MED ORDER — MENTHOL 3 MG MT LOZG: 1.0000 | LOZENGE | OROMUCOSAL | Status: DC | PRN

## 2014-08-06 MED ORDER — MORPHINE SULFATE (PF) 0.5 MG/ML IJ SOLN
INTRAMUSCULAR | Status: DC | PRN
  Administered 2014-08-06: 4 mg via EPIDURAL

## 2014-08-06 MED ORDER — OXYCODONE-ACETAMINOPHEN 5-325 MG PO TABS
2.0000 | ORAL_TABLET | ORAL | Status: DC | PRN
  Administered 2014-08-06: 2 via ORAL
  Filled 2014-08-06: qty 2

## 2014-08-06 MED ORDER — DIBUCAINE 1 % RE OINT: 1.0000 "application " | TOPICAL_OINTMENT | RECTAL | Status: DC | PRN

## 2014-08-06 MED ORDER — OXYTOCIN 40 UNITS IN LACTATED RINGERS INFUSION - SIMPLE MED
INTRAVENOUS | Status: AC
  Filled 2014-08-06: qty 1000

## 2014-08-06 MED ORDER — CEFAZOLIN SODIUM-DEXTROSE 2-3 GM-% IV SOLR
INTRAVENOUS | Status: DC | PRN
  Administered 2014-08-06: 2 g via INTRAVENOUS

## 2014-08-06 MED ORDER — ONDANSETRON HCL 4 MG/2ML IJ SOLN
INTRAMUSCULAR | Status: AC
  Filled 2014-08-06: qty 2

## 2014-08-06 MED ORDER — FENTANYL CITRATE 0.05 MG/ML IJ SOLN
INTRAMUSCULAR | Status: AC
  Filled 2014-08-06: qty 2

## 2014-08-06 MED ORDER — ZOLPIDEM TARTRATE 5 MG PO TABS: 5.0000 mg | ORAL_TABLET | Freq: Every evening | ORAL | Status: DC | PRN

## 2014-08-06 MED ORDER — IBUPROFEN 600 MG PO TABS
600.0000 mg | ORAL_TABLET | Freq: Four times a day (QID) | ORAL | Status: DC
  Administered 2014-08-06 – 2014-08-08 (×8): 600 mg via ORAL
  Filled 2014-08-06 (×9): qty 1

## 2014-08-06 MED ORDER — OXYTOCIN 10 UNIT/ML IJ SOLN
INTRAMUSCULAR | Status: AC
  Filled 2014-08-06: qty 4

## 2014-08-06 MED ORDER — NALOXONE HCL 1 MG/ML IJ SOLN: 1.0000 ug/kg/h | INTRAVENOUS | Status: DC | PRN

## 2014-08-06 MED ORDER — SIMETHICONE 80 MG PO CHEW
80.0000 mg | CHEWABLE_TABLET | Freq: Three times a day (TID) | ORAL | Status: DC
  Administered 2014-08-06 – 2014-08-07 (×5): 80 mg via ORAL
  Filled 2014-08-06 (×5): qty 1

## 2014-08-06 MED ORDER — SIMETHICONE 80 MG PO CHEW: 80.0000 mg | CHEWABLE_TABLET | ORAL | Status: DC | PRN

## 2014-08-06 MED ORDER — NALOXONE HCL 1 MG/ML IJ SOLN
1.0000 ug/kg/h | INTRAVENOUS | Status: DC | PRN
  Filled 2014-08-06: qty 2

## 2014-08-06 MED ORDER — CEFAZOLIN SODIUM-DEXTROSE 2-3 GM-% IV SOLR
2.0000 g | Freq: Once | INTRAVENOUS | Status: DC
  Filled 2014-08-06: qty 50

## 2014-08-06 SURGICAL SUPPLY — 32 items
BENZOIN TINCTURE PRP APPL 2/3 (GAUZE/BANDAGES/DRESSINGS) ×2 IMPLANT
CLAMP CORD UMBIL (MISCELLANEOUS) IMPLANT
CLOTH BEACON ORANGE TIMEOUT ST (SAFETY) ×2 IMPLANT
DERMABOND ADVANCED (GAUZE/BANDAGES/DRESSINGS)
DERMABOND ADVANCED .7 DNX12 (GAUZE/BANDAGES/DRESSINGS) IMPLANT
DRAPE SHEET LG 3/4 BI-LAMINATE (DRAPES) IMPLANT
DRSG OPSITE POSTOP 4X10 (GAUZE/BANDAGES/DRESSINGS) ×2 IMPLANT
DURAPREP 26ML APPLICATOR (WOUND CARE) ×2 IMPLANT
ELECT REM PT RETURN 9FT ADLT (ELECTROSURGICAL) ×2
ELECTRODE REM PT RTRN 9FT ADLT (ELECTROSURGICAL) ×1 IMPLANT
EXTRACTOR VACUUM KIWI (MISCELLANEOUS) ×2 IMPLANT
EXTRACTOR VACUUM M CUP 4 TUBE (SUCTIONS) IMPLANT
GLOVE BIO SURGEON STRL SZ 6 (GLOVE) ×2 IMPLANT
GLOVE BIOGEL PI IND STRL 6 (GLOVE) ×2 IMPLANT
GLOVE BIOGEL PI INDICATOR 6 (GLOVE) ×2
GOWN STRL REUS W/TWL LRG LVL3 (GOWN DISPOSABLE) ×4 IMPLANT
KIT ABG SYR 3ML LUER SLIP (SYRINGE) ×2 IMPLANT
NEEDLE HYPO 25X5/8 SAFETYGLIDE (NEEDLE) ×2 IMPLANT
NS IRRIG 1000ML POUR BTL (IV SOLUTION) ×2 IMPLANT
PACK C SECTION WH (CUSTOM PROCEDURE TRAY) ×2 IMPLANT
PAD OB MATERNITY 4.3X12.25 (PERSONAL CARE ITEMS) ×2 IMPLANT
STAPLER VISISTAT 35W (STAPLE) IMPLANT
STRIP CLOSURE SKIN 1/2X4 (GAUZE/BANDAGES/DRESSINGS) ×2 IMPLANT
SUT CHROMIC 0 CTX 36 (SUTURE) ×6 IMPLANT
SUT MON AB 2-0 CT1 27 (SUTURE) ×2 IMPLANT
SUT PDS AB 0 CT1 27 (SUTURE) IMPLANT
SUT PLAIN 0 NONE (SUTURE) IMPLANT
SUT VIC AB 0 CT1 36 (SUTURE) ×2 IMPLANT
SUT VIC AB 4-0 KS 27 (SUTURE) ×2 IMPLANT
TOWEL OR 17X24 6PK STRL BLUE (TOWEL DISPOSABLE) ×2 IMPLANT
TRAY FOLEY CATH 14FR (SET/KITS/TRAYS/PACK) IMPLANT
WATER STERILE IRR 1000ML POUR (IV SOLUTION) ×2 IMPLANT

## 2014-08-06 NOTE — Anesthesia Postprocedure Evaluation (Signed)
  Anesthesia Post-op Note  Patient: Allison Ewing  Procedure(s) Performed: Procedure(s): CESAREAN SECTION (N/A)  Patient Location: PACU and Mother/Baby  Anesthesia Type:Epidural  Level of Consciousness: awake  Airway and Oxygen Therapy: Patient Spontanous Breathing  Post-op Pain: none  Post-op Assessment: Post-op Vital signs reviewed, Patient's Cardiovascular Status Stable, Respiratory Function Stable, Patent Airway, No signs of Nausea or vomiting, Adequate PO intake, Pain level controlled, No headache, No backache, No residual numbness and No residual motor weakness  Post-op Vital Signs: Reviewed and stable  Last Vitals:  Filed Vitals:   08/06/14 1300  BP: 122/66  Pulse: 90  Temp: 37.4 C  Resp: 20    Complications: No apparent anesthesia complications

## 2014-08-06 NOTE — Progress Notes (Signed)
Patient was called 7-8cm 6 hours ago.  Despite pitocin and changes in position the patient has not made cervical change.  The patient is counseled for C/S for arrest of dilation including risk of bleeding, infection, scarring and damage to surrounding structures.  The patient is informed of implications in future pregnancies.  All questions are answered and the patient wishes to proceed.   Mitchel HonourMegan Elodie Panameno, DO

## 2014-08-06 NOTE — Anesthesia Postprocedure Evaluation (Signed)
  Anesthesia Post-op Note  Patient: Allison Ewing  Procedure(s) Performed: Procedure(s): CESAREAN SECTION (N/A)  Patient Location: PACU  Anesthesia Type:Epidural  Level of Consciousness: awake and alert   Airway and Oxygen Therapy: Patient Spontanous Breathing  Post-op Pain: mild  Post-op Assessment: Post-op Vital signs reviewed, Patient's Cardiovascular Status Stable, Respiratory Function Stable, Patent Airway, No signs of Nausea or vomiting and Pain level controlled  Post-op Vital Signs: Reviewed and stable  Last Vitals:  Filed Vitals:   08/06/14 0700  BP: 138/74  Pulse: 104  Temp:   Resp: 16    Complications: No apparent anesthesia complications

## 2014-08-06 NOTE — Op Note (Signed)
Allison Ewing PROCEDURE DATE: 08/05/2014 - 08/06/2014  PREOPERATIVE DIAGNOSIS: Intrauterine pregnancy at  3930w0d weeks gestation with arrest of dilation  POSTOPERATIVE DIAGNOSIS: The same  PROCEDURE:   Primary Low Transverse Cesarean Section  SURGEON:  Dr. Mitchel HonourMegan Avontae Burkhead  INDICATIONS: Allison Ewing Comella is a 28 y.o. G2P0010 at 2330w0d scheduled for cesarean section secondary arrest of dilation at 7.5 cm.  The risks of cesarean section discussed with the patient included but were not limited to: bleeding which may require transfusion or reoperation; infection which may require antibiotics; injury to bowel, bladder, ureters or other surrounding organs; injury to the fetus; need for additional procedures including hysterectomy in the event of a life-threatening hemorrhage; placental abnormalities wth subsequent pregnancies, incisional problems, thromboembolic phenomenon and other postoperative/anesthesia complications. The patient concurred with the proposed plan, giving informed written consent for the procedure.    FINDINGS:  Viable female infant in cephalic presentation, APGARs 9,9:  Weight 8#11  Clear amniotic fluid.  Intact placenta, three vessel cord.  Grossly normal uterus, ovaries and fallopian tubes. .   ANESTHESIA:    Epidural ESTIMATED BLOOD LOSS: 700 ml SPECIMENS: Placenta sent to L&D COMPLICATIONS: None immediate  PROCEDURE IN DETAIL:  The patient received intravenous antibiotics and had sequential compression devices applied to her lower extremities while in the preoperative area.  She was then taken to the operating room where epidural anesthesia was dosed up to surgical level and was found to be adequate. She was then placed in a dorsal supine position with a leftward tilt, and prepped and draped in a sterile manner.  A foley catheter was placed into her bladder and attached to constant gravity.  After an adequate timeout was performed, a Pfannenstiel skin incision was made with scalpel and  carried through to the underlying layer of fascia. The fascia was incised in the midline and this incision was extended bilaterally using the Mayo scissors. Kocher clamps were applied to the superior aspect of the fascial incision and the underlying rectus muscles were dissected off bluntly. A similar process was carried out on the inferior aspect of the facial incision. The rectus muscles were separated in the midline bluntly and the peritoneum was entered bluntly.   Bladder flap was created sharply and developed bluntly.  Bladder blade was placed.  A transverse hysterotomy was made with a scalpel and extended bilaterally bluntly. The bladder blade was then removed. The infant was successfully delivered, and cord was clamped and cut and infant was handed over to awaiting neonatology team. Uterine massage was then administered and the placenta delivered intact with three-vessel cord. The uterus was cleared of clot and debris.  The hysterotomy was closed with 0 chromic.  A second imbricating suture of 0-chromic was used to reinforce the incision and aid in hemostasis.  The peritoneum and rectus muscles were noted to be hemostatic and were reapproximated using 2-0 monocryl.  The fascia was closed with 0-Vicryl in a running fashion with good restoration of anatomy.  The subcutaneus tissue was copiously irrigated.  The skin was closed with 4-0 Vicryl in a subcuticular fashion.  Pt tolerated the procedure will.  All counts were correct x2.  Pt went to the recovery room in stable condition.

## 2014-08-06 NOTE — Addendum Note (Signed)
Addendum created 08/06/14 1426 by Suella Groveoderick C Antonia Culbertson, CRNA   Modules edited: Notes Section   Notes Section:  File: [id number]

## 2014-08-06 NOTE — Addendum Note (Signed)
Addendum created 08/06/14 0856 by Tyrone AppleMichael A. Malen GauzeFoster, MD   Modules edited: Orders, PRL Based Order Sets

## 2014-08-06 NOTE — Progress Notes (Signed)
Previous reinforced pressure dressing now stained on the left third of the dressing, with scant droplets of blood seeping through the tape on that side. Dressing reinforced on the left side with 2 packages of sterile 4"x4" gauze.

## 2014-08-06 NOTE — Transfer of Care (Signed)
Immediate Anesthesia Transfer of Care Note  Patient: Allison Ewing  Procedure(s) Performed: Procedure(s): CESAREAN SECTION (N/A)  Patient Location: PACU  Anesthesia Type:Epidural  Level of Consciousness: awake, alert  and oriented  Airway & Oxygen Therapy: Patient Spontanous Breathing  Post-op Assessment: Report given to PACU RN and Post -op Vital signs reviewed and stable  Post vital signs: Reviewed and stable  Complications: No apparent anesthesia complications

## 2014-08-07 ENCOUNTER — Encounter (HOSPITAL_COMMUNITY): Payer: Self-pay | Admitting: Obstetrics & Gynecology

## 2014-08-07 LAB — CBC
HEMATOCRIT: 23.1 % — AB (ref 36.0–46.0)
Hemoglobin: 7.8 g/dL — ABNORMAL LOW (ref 12.0–15.0)
MCH: 31.7 pg (ref 26.0–34.0)
MCHC: 33.8 g/dL (ref 30.0–36.0)
MCV: 93.9 fL (ref 78.0–100.0)
Platelets: 188 10*3/uL (ref 150–400)
RBC: 2.46 MIL/uL — AB (ref 3.87–5.11)
RDW: 14 % (ref 11.5–15.5)
WBC: 11.5 10*3/uL — AB (ref 4.0–10.5)

## 2014-08-07 LAB — BIRTH TISSUE RECOVERY COLLECTION (PLACENTA DONATION)

## 2014-08-07 NOTE — Lactation Note (Signed)
This note was copied from the chart of Allison Ewing. Lactation Consultation Note New mom states BF going well. Baby will start out w/wide open mouth for a deep latch but then will pull off to the end of the nipple. Encouraged to use support for positioning to keep baby closer so cheek touches breast. Denies soreness to nipples. Hand expression w/noted colostrum. Mom encouraged to feed baby 8-12 times/24 hours and with feeding cues. Mom encouraged to do skin-to-skin.Mom encouraged to waken baby for feeds. Referred to Baby and Me Book in Breastfeeding section Pg. 22-23 for position options and Proper latch demonstration.[facility] brochure given w/resources, support groups and LC services. Educated about newborn behavior. Encouraged to call for assistance if needed and to verify proper latch.Encouraged comfort during BF so colostrum flows better and mom will enjoy the feeding longer. Taking deep breaths and breast massage during BF. Dad holding baby. Mom has good support.  Patient Name: Allison Clovis Caoutumn Harkless OZHYQ'MToday's Date: 08/07/2014 Reason for consult: Initial assessment   Maternal Data Has patient been taught Hand Expression?: Yes Does the patient have breastfeeding experience prior to this delivery?: No  Feeding Feeding Type: Breast Fed Length of feed: 25 min  LATCH Score/Interventions                      Lactation Tools Discussed/Used     Consult Status Consult Status: Follow-up Date: 08/07/14 Follow-up type: In-patient    Charyl DancerCARVER, Raheem Kolbe G 08/07/2014, 12:45 AM

## 2014-08-07 NOTE — Progress Notes (Signed)
Subjective: Postpartum Day one: Cesarean Delivery Patient reports incisional pain, tolerating PO and no problems voiding.    Objective: Vital signs in last 24 hours: Temp:  [98.6 F (37 C)-99.8 F (37.7 C)] 98.6 F (37 C) (10/27 0501) Pulse Rate:  [77-105] 87 (10/27 0501) Resp:  [16-20] 18 (10/27 0501) BP: (106-133)/(54-77) 133/77 mmHg (10/27 0501) SpO2:  [92 %-100 %] 94 % (10/26 1710)  Physical Exam:  General: alert Lochia: appropriate Uterine Fundus: firm Incision: healing well DVT Evaluation: No evidence of DVT seen on physical exam.   Recent Labs  08/05/14 1427 08/07/14 0615  HGB 10.9* 7.8*  HCT 32.0* 23.1*    Assessment/Plan: Status post Cesarean section. Doing well postoperatively.  Continue current care.  Genita Nilsson S 08/07/2014, 8:11 AM

## 2014-08-07 NOTE — Lactation Note (Signed)
This note was copied from the chart of Allison Ewing. Lactation Consultation Note Follow up visit at 41 hours of age.  Baby has only had 5 feedings in the past 24 hours with 1 void and 3 stools.  Discussed feeding log with mom as it was not updated from 0130 until 1800 and mom does report 2 additional feedings after 1250.  Mom reports no attempt from 0130-1250 due to sleeping and circumcision.   Explained to mom importance of baby eating every 2-3 hours to total 8-12 good feedings in 24 hours.  Mom is instructed to wake baby every 3 hours and to feed with early feeding cues. Mom reports some nipple pain and her nipples look pinched after feedings.  Encourage mom to get deep latch with wide flanged lips, pillow support and keep baby active for feeding.   Baby initially did not latch well and then diaper was changed.  Mom has easily expressed colostrum.  Baby then latched well without assist, and maintained latch for several minutes.  Mom reports baby does not like to be unswaddled, encouraged to place baby STS for feedings to wake baby if he is sleepy.  Discussed cluster feeding.    Patient Name: Allison Ewing ZOXWR'UToday's Date: 08/07/2014 Reason for consult: Follow-up assessment   Maternal Data Has patient been taught Hand Expression?: Yes Does the patient have breastfeeding experience prior to this delivery?: No  Feeding Feeding Type: Breast Fed Length of feed:  (several minutes observed)  LATCH Score/Interventions Latch: Repeated attempts needed to sustain latch, nipple held in mouth throughout feeding, stimulation needed to elicit sucking reflex.  Audible Swallowing: A few with stimulation Intervention(s): Skin to skin;Hand expression  Type of Nipple: Everted at rest and after stimulation  Comfort (Breast/Nipple): Soft / non-tender     Hold (Positioning): Assistance needed to correctly position infant at breast and maintain latch. Intervention(s): Skin to skin;Position  options;Support Pillows;Breastfeeding basics reviewed  LATCH Score: 7  Lactation Tools Discussed/Used     Consult Status Consult Status: Follow-up Date: 08/08/14 Follow-up type: In-patient    [name], [name] 08/07/2014, 10:04 PM

## 2014-08-08 MED ORDER — OXYCODONE-ACETAMINOPHEN 5-325 MG PO TABS: 1.0000 | ORAL_TABLET | ORAL | Status: DC | PRN

## 2014-08-08 MED ORDER — IBUPROFEN 600 MG PO TABS: 600.0000 mg | ORAL_TABLET | Freq: Four times a day (QID) | ORAL | Status: DC

## 2014-08-08 NOTE — Discharge Summary (Signed)
Obstetric Discharge Summary Reason for Admission: onset of labor Prenatal Procedures: ultrasound Intrapartum Procedures: cesarean: low cervical, transverse Postpartum Procedures: none Complications-Operative and Postpartum: none Hemoglobin  Date Value Ref Range Status  08/07/2014 7.8* 12.0 - 15.0 Ewing/dL Final     DELTA CHECK NOTED     REPEATED TO VERIFY     HCT  Date Value Ref Range Status  08/07/2014 23.1* 36.0 - 46.0 % Final    Physical Exam:  General: alert and cooperative Lochia: appropriate Uterine Fundus: firm Incision: healing well DVT Evaluation: No evidence of DVT seen on physical exam. Negative Homan's sign. No cords or calf tenderness. Calf/Ankle edema is present.  Discharge Diagnoses: Term Pregnancy-delivered  Discharge Information: Date: 08/08/2014 Activity: pelvic rest Diet: routine Medications: PNV, Ibuprofen, Percocet and zoloft Condition: stable Instructions: refer to practice specific booklet Discharge to: home   Newborn Data: Live born female  Birth Weight: 8 lb 11 oz (3941 Ewing) APGAR: 9, 9  Home with mother.  Allison Ewing 08/08/2014, 8:23 AM

## 2014-08-08 NOTE — Plan of Care (Signed)
Problem: Discharge Progression Outcomes Goal: MMR given as ordered Outcome: Not Applicable Date Met:  67/12/45 Pt declined

## 2014-08-13 ENCOUNTER — Encounter (HOSPITAL_COMMUNITY): Payer: Self-pay | Admitting: Obstetrics & Gynecology

## 2014-09-19 ENCOUNTER — Other Ambulatory Visit: Payer: Self-pay | Admitting: Obstetrics and Gynecology

## 2014-09-20 LAB — CYTOLOGY - PAP

## 2015-12-17 DIAGNOSIS — H5213 Myopia, bilateral: Secondary | ICD-10-CM | POA: Diagnosis not present

## 2016-01-11 LAB — HM PAP SMEAR

## 2016-02-27 DIAGNOSIS — Z6821 Body mass index (BMI) 21.0-21.9, adult: Secondary | ICD-10-CM | POA: Diagnosis not present

## 2016-02-27 DIAGNOSIS — Z01419 Encounter for gynecological examination (general) (routine) without abnormal findings: Secondary | ICD-10-CM | POA: Diagnosis not present

## 2016-02-27 DIAGNOSIS — F413 Other mixed anxiety disorders: Secondary | ICD-10-CM | POA: Diagnosis not present

## 2016-02-27 DIAGNOSIS — F410 Panic disorder [episodic paroxysmal anxiety] without agoraphobia: Secondary | ICD-10-CM | POA: Diagnosis not present

## 2016-05-16 DIAGNOSIS — Z0000 Encounter for general adult medical examination without abnormal findings: Secondary | ICD-10-CM | POA: Diagnosis not present

## 2016-05-16 LAB — LIPID PANEL
CHOLESTEROL: 145 mg/dL (ref 0–200)
HDL: 62 mg/dL (ref 35–70)
LDL Cholesterol: 83 mg/dL
TRIGLYCERIDES: 76 mg/dL (ref 40–160)

## 2016-05-16 LAB — BASIC METABOLIC PANEL
BUN: 12 mg/dL (ref 4–21)
CREATININE: 0.7 mg/dL (ref 0.5–1.1)
Glucose: 84 mg/dL
POTASSIUM: 4.5 mmol/L (ref 3.4–5.3)
SODIUM: 133 mmol/L — AB (ref 137–147)

## 2016-05-16 LAB — HEPATIC FUNCTION PANEL
ALT: 28 U/L (ref 7–35)
AST: 27 U/L (ref 13–35)
Alkaline Phosphatase: 62 U/L (ref 25–125)
BILIRUBIN, TOTAL: 0.5 mg/dL

## 2016-05-16 LAB — TSH: TSH: 0.54 u[IU]/mL (ref 0.41–5.90)

## 2016-05-16 LAB — CBC AND DIFFERENTIAL
HEMATOCRIT: 39 % (ref 36–46)
HEMOGLOBIN: 13.6 g/dL (ref 12.0–16.0)
Neutrophils Absolute: 51 /uL
Platelets: 224 10*3/uL (ref 150–399)
WBC: 5.1 10*3/mL

## 2016-06-03 DIAGNOSIS — Z0000 Encounter for general adult medical examination without abnormal findings: Secondary | ICD-10-CM | POA: Diagnosis not present

## 2016-06-13 ENCOUNTER — Ambulatory Visit (HOSPITAL_COMMUNITY)
Admission: EM | Admit: 2016-06-13 | Discharge: 2016-06-13 | Disposition: A | Discharge status: Home / Self Care | Payer: 59 | Attending: Family Medicine | Admitting: Family Medicine

## 2016-06-13 ENCOUNTER — Encounter (HOSPITAL_COMMUNITY): Payer: Self-pay | Admitting: Emergency Medicine

## 2016-06-13 DIAGNOSIS — F419 Anxiety disorder, unspecified: Secondary | ICD-10-CM | POA: Insufficient documentation

## 2016-06-13 DIAGNOSIS — N390 Urinary tract infection, site not specified: Secondary | ICD-10-CM | POA: Insufficient documentation

## 2016-06-13 DIAGNOSIS — Z79899 Other long term (current) drug therapy: Secondary | ICD-10-CM | POA: Insufficient documentation

## 2016-06-13 DIAGNOSIS — Z87891 Personal history of nicotine dependence: Secondary | ICD-10-CM | POA: Insufficient documentation

## 2016-06-13 LAB — POCT URINALYSIS DIP (DEVICE)
Bilirubin Urine: NEGATIVE
GLUCOSE, UA: NEGATIVE mg/dL
KETONES UR: NEGATIVE mg/dL
Nitrite: NEGATIVE
Protein, ur: 100 mg/dL — AB
Urobilinogen, UA: 0.2 mg/dL (ref 0.0–1.0)
pH: 6 (ref 5.0–8.0)

## 2016-06-13 LAB — POCT PREGNANCY, URINE: PREG TEST UR: NEGATIVE

## 2016-06-13 MED ORDER — NITROFURANTOIN MONOHYD MACRO 100 MG PO CAPS: 100.0000 mg | ORAL_CAPSULE | Freq: Two times a day (BID) | ORAL | 0 refills | Status: AC

## 2016-06-13 NOTE — ED Notes (Signed)
Call back number verified and updated in EPIC... Adv pt to not have SI until lab results comeback neg.... Also adv pt lab results will be on MyChart; instructions given .... Pt verb understanding.   

## 2016-06-13 NOTE — Discharge Instructions (Signed)
Start Macrobid twice a day for 5 days. Increase fluid intake. May take Tylenol or Ibuprofen as needed for pain. Follow-up with your primary care provider in 3 days if not improving.

## 2016-06-13 NOTE — ED Triage Notes (Signed)
Here for UTI sx onset 1600 today associated w/abd pain, nauseas, hematuria, urinary freq/urgency  Denies fevers, chills  A&O x4... NAD

## 2016-06-13 NOTE — ED Notes (Signed)
Urine obtained while patient in lobby waiting room, specimen in the lab

## 2016-06-14 NOTE — ED Provider Notes (Signed)
CSN: [id number]     Arrival date & time 06/13/16  1843 History   First MD Initiated Contact with Patient 06/13/16 2021     Chief Complaint  Patient presents with  . Urinary Tract Infection   (Consider location/radiation/quality/duration/timing/severity/associated sxs/prior Treatment) 30 year old female presents with urinary frequency, urgency, lower abdominal pressure, nausea and blood in her urine that started this afternoon. She denies any fever, vomiting or back pain. She was concerned about the blood in her urine since she has never had a UTI before. She has not taken any medication for symptoms.       Past Medical History:  Diagnosis Date  . Anxiety   . Vaginal Pap smear, abnormal    Past Surgical History:  Procedure Laterality Date  . CESAREAN SECTION N/A 08/06/2014   Procedure: CESAREAN SECTION;  Surgeon: Mitchel HonourMegan Morris, DO;  Location: [facility];  Service: Obstetrics;  Laterality: N/A;  . LASER ABLATION OF THE CERVIX    . LEEP     Family History  Problem Relation Age of Onset  . Diabetes Maternal Aunt   . Stroke Maternal Grandmother   . Hypertension Maternal Grandmother   . Cancer Maternal Grandfather     lung  . Hypertension Paternal Grandmother   . Cancer Paternal Grandmother     cervical   Social History  Substance Use Topics  . Smoking status: Former Smoker    Packs/day: 1.00    Types: Cigarettes  . Smokeless tobacco: Never Used  . Alcohol use Yes   OB History    Gravida Para Term Preterm AB Living   2 1 1   1 1    SAB TAB Ectopic Multiple Live Births   1       1     Review of Systems  Constitutional: Negative for chills, fatigue and fever.  Cardiovascular: Negative for chest pain.  Gastrointestinal: Positive for abdominal pain (pressure) and nausea. Negative for blood in stool, constipation, diarrhea and vomiting.  Genitourinary: Positive for frequency, hematuria and urgency. Negative for dysuria, flank pain, vaginal bleeding and vaginal discharge.   Musculoskeletal: Negative for back pain.  Skin: Negative.   Neurological: Negative for dizziness, syncope, weakness, light-headedness and headaches.  Hematological: Negative.     Allergies  Review of patient's allergies indicates no known allergies.  Home Medications   Prior to Admission medications   Medication Sig Start Date End Date Taking? Authorizing Provider  buPROPion (WELLBUTRIN) 100 MG tablet Take 100 mg by mouth 2 (two) times daily.   Yes Historical Provider, MD  sertraline (ZOLOFT) 50 MG tablet Take 50 mg by mouth daily.   Yes Historical Provider, MD  ibuprofen (ADVIL,MOTRIN) 600 MG tablet Take 1 tablet (600 mg total) by mouth every 6 (six) hours. 08/08/14   Julio Sicksarol Curtis, NP  nitrofurantoin, macrocrystal-monohydrate, (MACROBID) 100 MG capsule Take 1 capsule (100 mg total) by mouth 2 (two) times daily. 06/13/16 06/18/16  Sudie GrumblingAnn Berry Raji Glinski, NP  Prenatal Vit-Fe Fumarate-FA (PRENATAL MULTIVITAMIN) TABS tablet Take 1 tablet by mouth daily at 12 noon.    Historical Provider, MD   Meds Ordered and Administered this Visit  Medications - No data to display  BP 143/91 (BP Location: Left Arm)   Pulse 86   Temp 98.3 F (36.8 C) (Oral)   Resp 12   LMP 05/25/2016   SpO2 100%  No data found.   Physical Exam  Constitutional: She is oriented to person, place, and time. She appears well-developed and well-nourished. No distress.  Cardiovascular: Normal rate, regular rhythm and normal heart sounds.   Pulmonary/Chest: Effort normal and breath sounds normal.  Abdominal: Soft. Bowel sounds are normal. She exhibits no mass. There is tenderness in the suprapubic area. There is no rigidity, no rebound, no guarding and no CVA tenderness.  Neurological: She is alert and oriented to person, place, and time.  Skin: Skin is warm and dry. Capillary refill takes less than 2 seconds.  Psychiatric: She has a normal mood and affect. Her behavior is normal. Judgment and thought content normal.    Urgent  Care Course   Clinical Course    Procedures (including critical care time)  Labs Review Labs Reviewed  POCT URINALYSIS DIP (DEVICE) - Abnormal; Notable for the following:       Result Value   Hgb urine dipstick LARGE (*)    Protein, ur 100 (*)    Leukocytes, UA SMALL (*)    All other components within normal limits  URINE CULTURE  POCT PREGNANCY, URINE    Imaging Review No results found.   Visual Acuity Review  Right Eye Distance:   Left Eye Distance:   Bilateral Distance:    Right Eye Near:   Left Eye Near:    Bilateral Near:         MDM   1. UTI (lower urinary tract infection)    Discussed results of urinalysis with patient. Recommend start Macrobid twice a day as directed. Encouraged to increase fluids. Urine sent for culture. Recommend follow-up pending urine culture results or with her primary care provider in 3 days if symptoms do not improve.     Sudie Grumbling, NP 06/14/16 0230

## 2016-06-16 LAB — URINE CULTURE: SPECIAL REQUESTS: NORMAL

## 2016-06-17 ENCOUNTER — Telehealth (HOSPITAL_COMMUNITY): Payer: Self-pay | Admitting: Emergency Medicine

## 2016-06-17 NOTE — Telephone Encounter (Signed)
Called pt and notified of recent lab results from visit 9/2 Pt ID'd properly... Reports she did not p/u Rx until yest and has only had 3 doses but feels a little better Adv her to finish complete course of antibiotics and to return if sx are not getting better or to f/u w/PCP Pt verb understanding.

## 2016-06-17 NOTE — Telephone Encounter (Signed)
-----   Message from Eustace MooreLaura W Murray, MD sent at 06/16/2016  3:29 PM EDT ----- Please let patient know that urine culture is positive for E coli, [facility]   Finish nitrofurantoin.  Recheck or followup PCP/Sara Furr for further evaluation if symptoms persist.  LM

## 2016-11-25 ENCOUNTER — Encounter: Payer: Self-pay | Admitting: General Practice

## 2017-02-03 MED FILL — SERTRALINE HCL 50 MG TABLET: 50 | 90 days supply | Qty: 180 | Fill #0

## 2017-03-15 ENCOUNTER — Ambulatory Visit: Payer: PRIVATE HEALTH INSURANCE | Admitting: Family Medicine

## 2017-03-31 ENCOUNTER — Encounter: Payer: Self-pay | Admitting: Family Medicine

## 2017-03-31 DIAGNOSIS — Z0000 Encounter for general adult medical examination without abnormal findings: Secondary | ICD-10-CM

## 2017-04-07 ENCOUNTER — Encounter: Payer: Self-pay | Admitting: Family Medicine

## 2017-04-07 ENCOUNTER — Ambulatory Visit (INDEPENDENT_AMBULATORY_CARE_PROVIDER_SITE_OTHER): Payer: 59 | Admitting: Family Medicine

## 2017-04-07 DIAGNOSIS — F32A Depression, unspecified: Secondary | ICD-10-CM | POA: Insufficient documentation

## 2017-04-07 DIAGNOSIS — F419 Anxiety disorder, unspecified: Secondary | ICD-10-CM | POA: Diagnosis not present

## 2017-04-07 DIAGNOSIS — F329 Major depressive disorder, single episode, unspecified: Secondary | ICD-10-CM | POA: Insufficient documentation

## 2017-04-07 MED ORDER — BUPROPION HCL 75 MG PO TABS: 75.0000 mg | ORAL_TABLET | Freq: Two times a day (BID) | ORAL | 3 refills | Status: DC

## 2017-04-07 MED FILL — buPROPion HCL 75 MG TABS: 75 | 30 days supply | Qty: 60 | Fill #0

## 2017-04-07 NOTE — Progress Notes (Signed)
Pre visit review using our clinic review tool, if applicable. No additional management support is needed unless otherwise documented below in the visit note. 

## 2017-04-07 NOTE — Assessment & Plan Note (Signed)
New to provider, ongoing for pt.  She reports sxs are fairly well controlled but her flight of ideas were better controlled when she was on Wellbutrin but it made her more irritable.  Will decrease dose to 75mg  daily using the short acting formulation.  Pt expressed understanding and is in agreement w/ plan.

## 2017-04-07 NOTE — Progress Notes (Signed)
   Subjective:    Patient ID: Tempie HoistAutumn M Glassburn, female    DOB: [birth year], 31 y.o.   MRN: [id number]  HPI New to establish.  Previous MD- Kristen LoaderFurr, [facility]- Morris (UTD on pap)  Anxiety- ongoing issue for pt.  Currently on Zoloft 100mg  daily.  Denies depression.  Pt feels medication is helpful.  Was taking in combination w/ Wellbutrin but she stopped this 3-4 months ago bc it made her irritable at times.  Pt is sleeping well.  She is no longer having panic attacks.  Denies palpitations, CP, SOB.  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   Review of Systems For ROS see HPI     Objective:   Physical Exam  Constitutional: She is oriented to person, place, and time. She appears well-developed and well-nourished. No distress.  HENT:  Head: Normocephalic and atraumatic.  Eyes: Conjunctivae and EOM are normal. Pupils are equal, round, and reactive to light.  Neck: Normal range of motion. Neck supple. No thyromegaly present.  Cardiovascular: Normal rate, regular rhythm, normal heart sounds and intact distal pulses.   No murmur heard. Pulmonary/Chest: Effort normal and breath sounds normal. No respiratory distress.  Musculoskeletal: She exhibits no edema.  Lymphadenopathy:    She has no cervical adenopathy.  Neurological: She is alert and oriented to person, place, and time.  Skin: Skin is warm and dry.  Psychiatric: She has a normal mood and affect. Her behavior is normal.  Vitals reviewed.         Assessment & Plan:

## 2017-04-07 NOTE — Patient Instructions (Addendum)
Schedule your complete physical in 6 months Start the Wellbutrin once daily- you can increase to twice daily if needed Continue the Zoloft once daily Call with any questions or concerns Welcome!!!  We're glad to have you!!!

## 2017-06-09 MED FILL — SERTRALINE HCL 50 MG TABLET: 50 | 90 days supply | Qty: 180 | Fill #1

## 2017-06-09 MED FILL — buPROPion HCL 75 MG TABS: 75 | 30 days supply | Qty: 60 | Fill #1

## 2017-07-07 ENCOUNTER — Emergency Department (HOSPITAL_COMMUNITY)
Admission: EM | Admit: 2017-07-07 | Discharge: 2017-07-07 | Disposition: A | Discharge status: Other Acute Inpatient Hospital | Payer: 59 | Attending: Emergency Medicine | Admitting: Emergency Medicine

## 2017-07-07 ENCOUNTER — Encounter (HOSPITAL_COMMUNITY): Payer: Self-pay | Admitting: *Deleted

## 2017-07-07 ENCOUNTER — Inpatient Hospital Stay (HOSPITAL_COMMUNITY)
Admission: AD | Admit: 2017-07-07 | Discharge: 2017-07-11 | DRG: 897 | Disposition: A | Discharge status: Home / Self Care | Payer: 59 | Source: Intra-hospital | Attending: Psychiatry | Admitting: Psychiatry

## 2017-07-07 ENCOUNTER — Encounter (HOSPITAL_COMMUNITY): Payer: Self-pay | Admitting: Emergency Medicine

## 2017-07-07 ENCOUNTER — Emergency Department (HOSPITAL_COMMUNITY): Payer: 59

## 2017-07-07 DIAGNOSIS — F1990 Other psychoactive substance use, unspecified, uncomplicated: Secondary | ICD-10-CM | POA: Insufficient documentation

## 2017-07-07 DIAGNOSIS — F332 Major depressive disorder, recurrent severe without psychotic features: Secondary | ICD-10-CM | POA: Diagnosis present

## 2017-07-07 DIAGNOSIS — Z87891 Personal history of nicotine dependence: Secondary | ICD-10-CM | POA: Diagnosis not present

## 2017-07-07 DIAGNOSIS — F1010 Alcohol abuse, uncomplicated: Secondary | ICD-10-CM | POA: Diagnosis present

## 2017-07-07 DIAGNOSIS — Z79899 Other long term (current) drug therapy: Secondary | ICD-10-CM

## 2017-07-07 DIAGNOSIS — F1994 Other psychoactive substance use, unspecified with psychoactive substance-induced mood disorder: Secondary | ICD-10-CM | POA: Diagnosis not present

## 2017-07-07 DIAGNOSIS — F329 Major depressive disorder, single episode, unspecified: Secondary | ICD-10-CM | POA: Diagnosis present

## 2017-07-07 DIAGNOSIS — Y906 Blood alcohol level of 120-199 mg/100 ml: Secondary | ICD-10-CM | POA: Diagnosis present

## 2017-07-07 DIAGNOSIS — F419 Anxiety disorder, unspecified: Secondary | ICD-10-CM | POA: Diagnosis present

## 2017-07-07 DIAGNOSIS — F17210 Nicotine dependence, cigarettes, uncomplicated: Secondary | ICD-10-CM | POA: Diagnosis not present

## 2017-07-07 DIAGNOSIS — F909 Attention-deficit hyperactivity disorder, unspecified type: Secondary | ICD-10-CM | POA: Diagnosis present

## 2017-07-07 DIAGNOSIS — R51 Headache: Secondary | ICD-10-CM | POA: Diagnosis not present

## 2017-07-07 DIAGNOSIS — F1410 Cocaine abuse, uncomplicated: Secondary | ICD-10-CM | POA: Diagnosis present

## 2017-07-07 DIAGNOSIS — F322 Major depressive disorder, single episode, severe without psychotic features: Secondary | ICD-10-CM | POA: Diagnosis not present

## 2017-07-07 HISTORY — DX: Major depressive disorder, single episode, unspecified: F32.9

## 2017-07-07 HISTORY — DX: Depression, unspecified: F32.A

## 2017-07-07 LAB — COMPREHENSIVE METABOLIC PANEL
ALK PHOS: 68 U/L (ref 38–126)
ALT: 19 U/L (ref 14–54)
AST: 26 U/L (ref 15–41)
Albumin: 4.8 g/dL (ref 3.5–5.0)
Anion gap: 10 (ref 5–15)
BUN: 6 mg/dL (ref 6–20)
CALCIUM: 9.6 mg/dL (ref 8.9–10.3)
CHLORIDE: 106 mmol/L (ref 101–111)
CO2: 26 mmol/L (ref 22–32)
CREATININE: 0.69 mg/dL (ref 0.44–1.00)
Glucose, Bld: 102 mg/dL — ABNORMAL HIGH (ref 65–99)
Potassium: 3.1 mmol/L — ABNORMAL LOW (ref 3.5–5.1)
Sodium: 142 mmol/L (ref 135–145)
Total Bilirubin: 0.1 mg/dL — ABNORMAL LOW (ref 0.3–1.2)
Total Protein: 8.2 g/dL — ABNORMAL HIGH (ref 6.5–8.1)

## 2017-07-07 LAB — CBC
HCT: 39 % (ref 36.0–46.0)
Hemoglobin: 13.5 g/dL (ref 12.0–15.0)
MCH: 30.6 pg (ref 26.0–34.0)
MCHC: 34.6 g/dL (ref 30.0–36.0)
MCV: 88.4 fL (ref 78.0–100.0)
PLATELETS: 267 10*3/uL (ref 150–400)
RBC: 4.41 MIL/uL (ref 3.87–5.11)
RDW: 12.3 % (ref 11.5–15.5)
WBC: 6.6 10*3/uL (ref 4.0–10.5)

## 2017-07-07 LAB — I-STAT BETA HCG BLOOD, ED (MC, WL, AP ONLY): I-stat hCG, quantitative: <5 m[IU]/mL (ref <5)

## 2017-07-07 LAB — SALICYLATE LEVEL

## 2017-07-07 LAB — ACETAMINOPHEN LEVEL: Acetaminophen (Tylenol), Serum: <10 ug/mL — ABNORMAL LOW (ref 10–30)

## 2017-07-07 LAB — RAPID URINE DRUG SCREEN, HOSP PERFORMED
Amphetamines: NOT DETECTED
Barbiturates: NOT DETECTED
Benzodiazepines: NOT DETECTED
Cocaine: POSITIVE — AB
OPIATES: NOT DETECTED
Tetrahydrocannabinol: NOT DETECTED

## 2017-07-07 LAB — ETHANOL: ALCOHOL ETHYL (B): 139 mg/dL — AB (ref <10)

## 2017-07-07 LAB — CBG MONITORING, ED: GLUCOSE-CAPILLARY: 77 mg/dL (ref 65–99)

## 2017-07-07 MED ORDER — POTASSIUM CHLORIDE CRYS ER 20 MEQ PO TBCR
20.0000 meq | EXTENDED_RELEASE_TABLET | Freq: Two times a day (BID) | ORAL | Status: AC
  Administered 2017-07-07 – 2017-07-08 (×2): 20 meq via ORAL
  Filled 2017-07-07 (×3): qty 1

## 2017-07-07 MED ORDER — SERTRALINE HCL 50 MG PO TABS
50.0000 mg | ORAL_TABLET | Freq: Every day | ORAL | Status: DC
  Administered 2017-07-07: 50 mg via ORAL
  Filled 2017-07-07 (×4): qty 1

## 2017-07-07 MED ORDER — ONDANSETRON HCL 4 MG PO TABS: 4.0000 mg | ORAL_TABLET | Freq: Three times a day (TID) | ORAL | Status: DC | PRN

## 2017-07-07 MED ORDER — IBUPROFEN 600 MG PO TABS: 600.0000 mg | ORAL_TABLET | Freq: Three times a day (TID) | ORAL | Status: DC | PRN

## 2017-07-07 MED ORDER — BUPROPION HCL 75 MG PO TABS
75.0000 mg | ORAL_TABLET | Freq: Every day | ORAL | Status: DC
  Filled 2017-07-07: qty 1

## 2017-07-07 MED ORDER — ACETAMINOPHEN 325 MG PO TABS: 650.0000 mg | ORAL_TABLET | Freq: Four times a day (QID) | ORAL | Status: DC | PRN

## 2017-07-07 MED ORDER — POTASSIUM CHLORIDE CRYS ER 20 MEQ PO TBCR
20.0000 meq | EXTENDED_RELEASE_TABLET | Freq: Two times a day (BID) | ORAL | Status: DC
  Administered 2017-07-07: 20 meq via ORAL
  Filled 2017-07-07: qty 1

## 2017-07-07 MED ORDER — SERTRALINE HCL 50 MG PO TABS: 50.0000 mg | ORAL_TABLET | Freq: Every day | ORAL | Status: DC

## 2017-07-07 MED ORDER — MAGNESIUM HYDROXIDE 400 MG/5ML PO SUSP: 30.0000 mL | Freq: Every day | ORAL | Status: DC | PRN

## 2017-07-07 MED ORDER — IBUPROFEN 200 MG PO TABS: 600.0000 mg | ORAL_TABLET | Freq: Three times a day (TID) | ORAL | Status: DC | PRN

## 2017-07-07 MED ORDER — BUPROPION HCL 75 MG PO TABS
75.0000 mg | ORAL_TABLET | Freq: Every day | ORAL | Status: DC
  Administered 2017-07-07: 75 mg via ORAL
  Filled 2017-07-07 (×5): qty 1

## 2017-07-07 MED ORDER — ALUM & MAG HYDROXIDE-SIMETH 200-200-20 MG/5ML PO SUSP: 30.0000 mL | Freq: Four times a day (QID) | ORAL | Status: DC | PRN

## 2017-07-07 MED ORDER — ZOLPIDEM TARTRATE 5 MG PO TABS: 5.0000 mg | ORAL_TABLET | Freq: Every evening | ORAL | Status: DC | PRN

## 2017-07-07 MED ORDER — NICOTINE 14 MG/24HR TD PT24
14.0000 mg | MEDICATED_PATCH | Freq: Once | TRANSDERMAL | Status: DC
  Filled 2017-07-07: qty 1

## 2017-07-07 MED ORDER — NICOTINE 14 MG/24HR TD PT24: 14.0000 mg | MEDICATED_PATCH | Freq: Once | TRANSDERMAL | Status: DC

## 2017-07-07 NOTE — ED Notes (Signed)
Bed: [facility] Expected date:  Expected time:  Means of arrival:  Comments: hold hall c

## 2017-07-07 NOTE — Care Management Note (Signed)
Case Management Note  CM consulted for FMLA paperwork questions.  Advised McDonald, PA that pt would need to speak to the specialist she is seeing, like the inpatient location she is being transferred to, or her PCP.  Confirmed with CSW who did not know any additional information on subject. No further CM needs noted at this time.

## 2017-07-07 NOTE — ED Notes (Signed)
Pt ambulatory w/ pehlam to [facility] with [facility].  Belongings given to driver.

## 2017-07-07 NOTE — ED Triage Notes (Signed)
Pt requests evaluation for substance abuse; last use this am 6 beers, tablet of klonopin, and cocaine. Continues to verbalizes suicidal ideation at time but not at present. Pt denies pain.

## 2017-07-07 NOTE — Progress Notes (Signed)
07/07/17 1349:  LRT went to pt room to offer activities, pt declined.  Caroll Rancher, LRT/CTRS

## 2017-07-07 NOTE — ED Provider Notes (Signed)
[facility]-EMERGENCY DEPT Provider Note   CSN: [id number] Arrival date & time: 07/07/17  7829     History   Chief Complaint Chief Complaint  Patient presents with  . Suicidal  . Addiction Problem    HPI Allison Ewing is a 31 y.o. female who presents to the emergency department with a chief complaint of alcohol and cocaine use with associated depressed mood and suicidal ideation.   She reports that her symptoms significantly worsened today after she was placed on a final warning at work yesterday. She reports she went home and stayed up all night after using cocaine, drank 6 beers, and took one tablet of klonopin. She states "I can't lose this job, I just can't."  The patient reports constant, worsening depressed mood that was triggered after her mother was admitted to the hospital in March 2018 with a lengthy stay and poor outcome. She reports that she has been trying to cope with her mother's condition by self-medicating with alcohol and cocaine.   She reports passive suicidal ideation and state that "sometimes when I'm driving down the road I think what if I just drove into oncoming traffic, but then I think no one ever really misses you when you die. I work in the [employer]. Think of all the overdoses and death you see. No one ever really cares after you are gone". She denies HI and auditory or visual hallucinations. No previous inpatient behavioral health admissions. No previous suicide attempts.   She reports she started using cocaine about a year ago after she entered into a new relationship. She reports that she's "always been a drinker" and "can't stop after having just one". She reports she is a current, every day smoker and typically smokes 4 cigarettes per day, but has smoked 1 ppd over the last few days since she has been drinking more heavily. She denies other recreational or IV drug use.  She reports that she currently co-parents her 23-year-old with her ex-husband. She  reports that she does not drink or use cocaine in front of her son, but will go on binges during the days that he is with his father.   No h/o of seizures from alcohol withdrawal. No h/o of DTs. She reports that she tripped and hit her right forehead on concrete this morning and is complaining of a HA. No LOC, nausea, or emesis. No CP, dyspnea, abdominal pain, or other medical complaints at this time.   PMH includes depressions. She is currently on Wellbutrin and sertraline.   The history is provided by the patient. No language interpreter was used.    Past Medical History:  Diagnosis Date  . Anxiety   . Anxiety   . Depression   . Vaginal Pap smear, abnormal     Patient Active Problem List   Diagnosis Date Noted  . Alcohol abuse 07/07/2017  . Cocaine abuse 07/07/2017  . Major depressive disorder, recurrent severe without psychotic features (HCC) 07/07/2017  . Anxiety 04/07/2017  . S/P cesarean section 08/06/2014    Past Surgical History:  Procedure Laterality Date  . CESAREAN SECTION N/A 08/06/2014   Procedure: CESAREAN SECTION;  Surgeon: Mitchel Honour, DO;  Location: [facility];  Service: Obstetrics;  Laterality: N/A;  . DILATION AND CURETTAGE OF UTERUS  2007  . LASER ABLATION OF THE CERVIX    . LEEP      OB History    Gravida Para Term Preterm AB Living   1  1   SAB TAB Ectopic Multiple Live Births   1       1       Home Medications    Prior to Admission medications   Medication Sig Start Date End Date Taking? Authorizing Provider  buPROPion (WELLBUTRIN) 75 MG tablet Take 1 tablet (75 mg total) by mouth 2 (two) times daily. Patient taking differently: Take 75 mg by mouth at bedtime.  04/07/17  Yes Sheliah Hatch, MD  fluticasone (FLONASE) 50 MCG/ACT nasal spray Place 1 spray into both nostrils daily.  06/03/16 07/07/17 Yes [provider]  loratadine (CLARITIN) 10 MG tablet Take 10 mg by mouth daily.    Yes [provider]  sertraline  (ZOLOFT) 50 MG tablet Take 50 mg by mouth at bedtime.   Yes [provider]    Family History Family History  Problem Relation Age of Onset  . Diabetes Maternal Aunt   . Aneurysm Maternal Grandmother   . Cancer Maternal Grandfather        lung  . Hypertension Paternal Grandmother   . Cancer Paternal Grandmother        cervical  . Stroke Paternal Grandmother   . Hyperlipidemia Mother   . Hypertension Mother   . Lung disease Father     Social History Social History  Substance Use Topics  . Smoking status: Former Smoker    Packs/day: 1.00    Types: Cigarettes    Quit date: 04/07/2016  . Smokeless tobacco: Never Used  . Alcohol use 3.6 oz/week    6 Cans of beer per week     Comment: Weekly use -- BAC was 139 on admission     Allergies   Patient has no known allergies.   Review of Systems Review of Systems  Constitutional: Negative for activity change, chills and fever.  Respiratory: Negative for shortness of breath.   Cardiovascular: Negative for chest pain.  Gastrointestinal: Negative for abdominal pain, diarrhea, nausea and vomiting.  Musculoskeletal: Negative for back pain.  Skin: Negative for rash.  Neurological: Positive for headaches. Negative for syncope.  Psychiatric/Behavioral: Positive for dysphoric mood, sleep disturbance and suicidal ideas. Negative for hallucinations and self-injury.   Physical Exam Updated Vital Signs BP 122/77 (BP Location: Left Arm)   Pulse 84   Temp 98.4 F (36.9 C) (Oral)   Resp 20   LMP 07/03/2017   SpO2 100%   Physical Exam  Constitutional: No distress.  HENT:  Head: Normocephalic.  2 cm hematoma to the right temporal area.   Eyes: Conjunctivae are normal.  Neck: Neck supple.  Cardiovascular: Normal rate, regular rhythm and normal heart sounds.  Exam reveals no gallop and no friction rub.   No murmur heard. Pulmonary/Chest: Effort normal. No respiratory distress. She has no wheezes. She has no rales.    Abdominal: Soft. She exhibits no distension.  Neurological: She is alert.  Cranial nerves II through XII grossly intact. Normal finger to nose. Sensation intact throughout. 5 out of 5 strength in bilateral upper and lower extremities.  Skin: Skin is warm. No rash noted.  Psychiatric: Her behavior is normal.  Nursing note and vitals reviewed.    ED Treatments / Results  Labs (all labs ordered are listed, but only abnormal results are displayed) Labs Reviewed  COMPREHENSIVE METABOLIC PANEL - Abnormal; Notable for the following:       Result Value   Potassium 3.1 (*)    Glucose, Bld 102 (*)    Total Protein 8.2 (*)  Total Bilirubin 0.1 (*)    All other components within normal limits  ETHANOL - Abnormal; Notable for the following:    Alcohol, Ethyl (B) 139 (*)    All other components within normal limits  ACETAMINOPHEN LEVEL - Abnormal; Notable for the following:    Acetaminophen (Tylenol), Serum <10 (*)    All other components within normal limits  RAPID URINE DRUG SCREEN, HOSP PERFORMED - Abnormal; Notable for the following:    Cocaine POSITIVE (*)    All other components within normal limits  SALICYLATE LEVEL  CBC  I-STAT BETA HCG BLOOD, ED ([facility], [facility], [facility] ONLY)  CBG MONITORING, ED    EKG  EKG Interpretation  Date/Time:  Wednesday July 07 2017 08:47:45 EDT Ventricular Rate:  117 PR Interval:    QRS Duration: 92 QT Interval:  329 QTC Calculation: 459 R Axis:   80 Text Interpretation:  Sinus tachycardia Biatrial enlargement RSR' in V1 or V2, probably normal variant Baseline wander in lead(s) V1 Abnormal ekg Confirmed by Gerhard Munch [phone]) on 07/07/2017 10:06:44 AM       Radiology Ct Head Wo Contrast  Result Date: 07/07/2017 CLINICAL DATA:  Headache.  Fall today EXAM: CT HEAD WITHOUT CONTRAST TECHNIQUE: Contiguous axial images were obtained from the base of the skull through the vertex without intravenous contrast. COMPARISON:  None. FINDINGS: Brain:  Ventricle size normal. Negative for infarct, hemorrhage, or mass lesion. Extra-axial fluid collection in the posterior fossa on the right lateral to the cerebellum most likely arachnoid cyst measuring approximately 17 x 26 mm. No mass-effect on the fourth ventricle Vascular: Negative for hyperdense vessel Skull: Negative Sinuses/Orbits: Negative Other: None IMPRESSION: No acute abnormality. 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 Electronically Signed   By: Marlan Palau M.D.   On: 07/07/2017 10:12    Procedures Procedures (including critical care time)  Medications Ordered in ED Medications  nicotine (NICODERM CQ - dosed in mg/24 hours) patch 14 mg (not administered)  potassium chloride SA (K-DUR,KLOR-CON) CR tablet 20 mEq (20 mEq Oral Given 07/07/17 1345)  buPROPion (WELLBUTRIN) tablet 75 mg (not administered)  sertraline (ZOLOFT) tablet 50 mg (not administered)  ibuprofen (ADVIL,MOTRIN) tablet 600 mg (not administered)  ondansetron (ZOFRAN) tablet 4 mg (not administered)  alum & mag hydroxide-simeth (MAALOX/MYLANTA) 200-200-20 MG/5ML suspension 30 mL (not administered)  zolpidem (AMBIEN) tablet 5 mg (not administered)     Initial Impression / Assessment and Plan / ED Course  I have reviewed the triage vital signs and the nursing notes.  Pertinent labs & imaging results that were available during my care of the patient were reviewed by me and considered in my medical decision making (see chart for details).     31 year old female presenting with depressed mood, passive suicidal ideation, and headache secondary to a fall from standing this AM. CT head negative for ICH or fracture. Incidental finding of arachnoid cyst, which was shared with the patient. Potassium 3.1, K-Dur replenishment ordered. Consulted TTS, who recommended inpatient treatment. She is pending bed placement at this time.  Final Clinical Impressions(s) / ED Diagnoses   Final  diagnoses:  Major depressive disorder, recurrent severe without psychotic features (HCC)  Cocaine abuse  Alcohol abuse  MDD (major depressive disorder), recurrent severe, without psychosis (HCC)  Substance use disorder    New Prescriptions New Prescriptions   No medications on file     Barkley Boards, PA-C 07/07/17 1742    Shaune Pollack, MD 07/11/17 1934

## 2017-07-07 NOTE — Progress Notes (Signed)
Allison Ewing is a 31 year old female admitted on voluntary basis. On admission, Allison Ewing reports that she went to the ED last night seeking help with depression and substance abuse issues. She reports that her main issue is alcohol and reports that she drinks on a daily basis. She also reports that she has been using cocaine and reports that her and her now ex-boyfriend used together. She reports that she is medication compliant but reports that she thinks her medications need to be changed. She denies SI on admission and is able to contract for safety while in the hospital. Allison Ewing does not display any overt signs or symptoms of withdrawal on admission. Allison Ewing was oriented to the unit and safety maintained.

## 2017-07-07 NOTE — ED Notes (Signed)
Pt ambulatory w/o difficulty to room 41, wanded prior to arrival
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
## 2017-07-07 NOTE — ED Notes (Signed)
Not able to place pt on cardiac monitoring. Pt is in hall

## 2017-07-07 NOTE — ED Notes (Signed)
TTS at bedside. 

## 2017-07-07 NOTE — Tx Team (Signed)
Initial Treatment Plan 07/07/2017 8:27 PM Allison Ewing ZOX:[id number]    PATIENT STRESSORS: Loss of boyfriend Occupational concerns Substance abuse   PATIENT STRENGTHS: Ability for insight Active sense of humor Average or above average intelligence Capable of independent living General fund of knowledge Motivation for treatment/growth   PATIENT IDENTIFIED PROBLEMS: Depression Substance Abuse Suicidal thoughts "I want to change my medications" "Mom got sick in March and that triggered some things"                     DISCHARGE CRITERIA:  Ability to meet basic life and health needs Improved stabilization in mood, thinking, and/or behavior Verbal commitment to aftercare and medication compliance Withdrawal symptoms are absent or subacute and managed without 24-hour nursing intervention  PRELIMINARY DISCHARGE PLAN: Attend aftercare/continuing care group Return to previous living arrangement  PATIENT/FAMILY INVOLVEMENT: This treatment plan has been presented to and reviewed with the patient, Allison Ewing, and/or family member, .  The patient and family have been given the opportunity to ask questions and make suggestions.  Allison Ewing, [place], [place] 07/07/2017, 8:27 PM

## 2017-07-07 NOTE — ED Notes (Signed)
Pt requested ice pk for head, ice pk given

## 2017-07-07 NOTE — ED Notes (Signed)
Report called, pt OK to transport [id number]

## 2017-07-07 NOTE — ED Notes (Signed)
Mia PA into see

## 2017-07-07 NOTE — ED Notes (Signed)
[facility] contacted for transport 

## 2017-07-07 NOTE — ED Notes (Signed)
Eating supper, is aware that transport will be here shortly

## 2017-07-08 DIAGNOSIS — F17210 Nicotine dependence, cigarettes, uncomplicated: Secondary | ICD-10-CM

## 2017-07-08 DIAGNOSIS — F332 Major depressive disorder, recurrent severe without psychotic features: Secondary | ICD-10-CM

## 2017-07-08 DIAGNOSIS — F1410 Cocaine abuse, uncomplicated: Secondary | ICD-10-CM

## 2017-07-08 DIAGNOSIS — F1010 Alcohol abuse, uncomplicated: Secondary | ICD-10-CM

## 2017-07-08 DIAGNOSIS — F1994 Other psychoactive substance use, unspecified with psychoactive substance-induced mood disorder: Secondary | ICD-10-CM

## 2017-07-08 LAB — BASIC METABOLIC PANEL
Anion gap: 8 (ref 5–15)
BUN: 15 mg/dL (ref 6–20)
CALCIUM: 8.9 mg/dL (ref 8.9–10.3)
CO2: 26 mmol/L (ref 22–32)
CREATININE: 0.78 mg/dL (ref 0.44–1.00)
Chloride: 105 mmol/L (ref 101–111)
GFR calc Af Amer: >60 mL/min (ref >60)
GLUCOSE: 93 mg/dL (ref 65–99)
POTASSIUM: 3.9 mmol/L (ref 3.5–5.1)
SODIUM: 139 mmol/L (ref 135–145)

## 2017-07-08 MED ORDER — NALTREXONE HCL 50 MG PO TABS
50.0000 mg | ORAL_TABLET | Freq: Every day | ORAL | Status: DC
  Administered 2017-07-08: 50 mg via ORAL
  Filled 2017-07-08 (×4): qty 1

## 2017-07-08 MED ORDER — VITAMIN B-1 100 MG PO TABS
100.0000 mg | ORAL_TABLET | Freq: Every day | ORAL | Status: DC
  Administered 2017-07-08 – 2017-07-11 (×4): 100 mg via ORAL
  Filled 2017-07-08 (×7): qty 1

## 2017-07-08 MED ORDER — BUPROPION HCL 75 MG PO TABS
150.0000 mg | ORAL_TABLET | Freq: Every day | ORAL | Status: DC
  Administered 2017-07-08 – 2017-07-11 (×4): 150 mg via ORAL
  Filled 2017-07-08 (×6): qty 2

## 2017-07-08 MED ORDER — CHLORDIAZEPOXIDE HCL 25 MG PO CAPS
25.0000 mg | ORAL_CAPSULE | Freq: Two times a day (BID) | ORAL | Status: DC
  Administered 2017-07-08: 25 mg via ORAL
  Filled 2017-07-08 (×2): qty 1

## 2017-07-08 MED ORDER — FOLIC ACID 1 MG PO TABS
1.0000 mg | ORAL_TABLET | Freq: Every day | ORAL | Status: DC
  Administered 2017-07-08 – 2017-07-11 (×4): 1 mg via ORAL
  Filled 2017-07-08 (×7): qty 1

## 2017-07-08 MED ORDER — BUPROPION HCL 75 MG PO TABS
75.0000 mg | ORAL_TABLET | Freq: Every day | ORAL | Status: DC
  Filled 2017-07-08 (×2): qty 1

## 2017-07-08 MED ORDER — CHLORDIAZEPOXIDE HCL 25 MG PO CAPS: 25.0000 mg | ORAL_CAPSULE | Freq: Four times a day (QID) | ORAL | Status: DC | PRN

## 2017-07-08 NOTE — Progress Notes (Signed)
[facility] Group Notes:  (Nursing/MHT/Case Management/Adjunct)  Date:  07/08/2017  Time:  2045  Type of Therapy:  wrap up group  Participation Level:  Active  Participation Quality:  Appropriate, Attentive, Sharing and Supportive  Affect:  Appropriate  Cognitive:  Appropriate  Insight:  Improving  Engagement in Group:  Engaged  Modes of Intervention:  Clarification, Education and Support  Summary of Progress/Problems:Pt shared that if she could change any one thing in her life it would be pushing away people that care. Pt shared that she is grateful for her 70 year old son.   [place], [place] 07/08/2017, 9:44 PM

## 2017-07-08 NOTE — Progress Notes (Signed)
D: Patient observed up and visible in the milieu. Frequent contacts made 1:1 throughout shift. Patient verbalizes to this Clinical research associate she is doing okay, appears to minimize her symptoms. Patient's affect anxious with congruent mood. Per self inventory and discussions with writer, rates depression at a 6/10, hopelessness at a 0/10 and anxiety at an 8/10. Rates sleep as good, appetite as poor, energy as low and concentration as poor.  States goal for today is "medication changes." Denies pain, physical problems. No withdrawal symptoms reported.  A: Medicated per orders, no prns requested or required. Med educations provided as new medications were started - vitamins, depade. Level III obs in place for safety. Emotional support offered and self inventory reviewed. Encouraged completion of Suicide Safety Plan and programming participation. Discussed POC with MD, SW.  Fall prevention plan in place and reviewed with patient as pt is a high fall risk due to hx of frequent falls.   R: Patient verbalizes understanding of POC, falls prevention education.  Patient denies SI/HI/AVH and remains safe on level III obs. Will continue to monitor closely and make verbal contact frequently.

## 2017-07-08 NOTE — [facility] Suicide Risk Assessment (Signed)
[facility] Admission Suicide Risk Assessment   Nursing information obtained from:    Demographic factors:    Current Mental Status:    Loss Factors:    Historical Factors:    Risk Reduction Factors:     Total Time spent with patient: 30 minutes Principal Problem: Substance induced mood disorder (HCC) Diagnosis:   Patient Active Problem List   Diagnosis Date Noted  . Substance induced mood disorder (HCC) [F19.94] 07/08/2017  . Alcohol abuse [F10.10] 07/07/2017  . Cocaine abuse [F14.10] 07/07/2017  . Major depressive disorder, recurrent severe without psychotic features (HCC) [F33.2] 07/07/2017  . Anxiety [F41.9] 04/07/2017  . S/P cesarean section [Z98.891] 08/06/2014   Subjective Data:  31 y.o Caucasian female, single, lives alone, employed. Personal and family history of SUD. Presented in company of her family. Reports suicidal thoughts and worsening depression. Reports increased use of alcohol, cocaine and benzodiazepines. BAL was 139 mg/dl. UDS was positive for cocaine. Potassium was low at presentation. Vital signs showed autonomic instability at presentation. Suicidal thoughts occurs only while intoxicated. No planning. No intent. Has never attempted suicide. No goodbye messages, no final arrangements. No command hallucination. No abnormal perception. No persecution. No thoughts of violence. No homicidal thoughts. No access to weapons.  She is agreeable to treatment recommendations.   Continued Clinical Symptoms:  Alcohol Use Disorder Identification Test Final Score (AUDIT): 31 The "Alcohol Use Disorders Identification Test", Guidelines for Use in Primary Care, Second Edition.  World Science writer [facility]). Score between 0-7:  no or low risk or alcohol related problems. Score between 8-15:  moderate risk of alcohol related problems. Score between 16-19:  high risk of alcohol related problems. Score 20 or above:  warrants further diagnostic evaluation for alcohol dependence and  treatment.   CLINICAL FACTORS:   Alcohol/Substance Abuse/Dependencies   Musculoskeletal: Strength & Muscle Tone: within normal limits Gait & Station: normal Patient leans: N/A  Psychiatric Specialty Exam: Physical Exam  ROS  Blood pressure 112/78, pulse 69, temperature 98.2 F (36.8 C), temperature source Oral, resp. rate 16, height  (1.6 m), weight 46.3 kg (102 lb), last menstrual period 07/03/2017.Body mass index is 18.07 kg/m.  General Appearance: As in H&P  Eye Contact:  As in H&P  Speech:  As in H&P  Volume:  As in H&P  Mood:  As in H&P   Affect:  As in H&P  Thought Process:  As in H&P  Orientation:  As in H&P  Thought Content:  As in H&P  Suicidal Thoughts:  As in H&P  Homicidal Thoughts:  As in H&P  Memory:  As in H&P  Judgement:  As in H&P  Insight:  As in H&P  Psychomotor Activity:  As in H&P  Concentration:  As in H&P  Recall:  As in H&P  Fund of Knowledge:  As in H&P  Language:  As in H&P  Akathisia:  As in H&P  Handed:  As in H&P  AIMS (if indicated):     Assets:  As in H&P  ADL's:  As in H&P  Cognition:  As in H&P  Sleep:  Number of Hours: 6.75      COGNITIVE FEATURES THAT CONTRIBUTE TO RISK:  None    SUICIDE RISK:   Minimal: No identifiable suicidal ideation.  Patients presenting with no risk factors but with morbid ruminations; may be classified as minimal risk based on the severity of the depressive symptoms  PLAN OF CARE:  As in H&P  I certify that inpatient  services furnished can reasonably be expected to improve the patient's condition.   Georgiann Cocker, MD 07/08/2017, 12:08 PM
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
## 2017-07-08 NOTE — BHH Counselor (Signed)
Adult Comprehensive Assessment  Patient ID: Allison Ewing, female   DOB: 28-May-1986, 31 y.o.   MRN: [id number]  Information Source: Information source: Patient  Current Stressors:  Educational / Learning stressors: associates degree Employment / Job issues: works as Clinical biochemist at [employer] Relationships: close with mother; brother; 81 yo son Surveyor, quantity / Lack of resources (include bankruptcy): income from employment; [employer] / Lack of housing: lives in apt. has son part time Physical health (include injuries & life threatening diseases): none identified Social relationships: some friends in community; family supports Substance abuse: alcohol abuse-chronic and ongoing. increased use over the last year; increased cocaine use over last few months (up to 3-4x per week).  Bereavement / Loss: recent breakup with boyfriend who introduced her to cocaine.   Living/Environment/Situation:  Living Arrangements: Children Living conditions (as described by patient or guardian): lives in apt. has 72 yo old son 50% of the time How long has patient lived in current situation?: few years.  What is atmosphere in current home: Comfortable  Family History:  Marital status: Single Are you sexually active?: Yes What is your sexual orientation?: heterosexual Has your sexual activity been affected by drugs, alcohol, medication, or emotional stress?: n/a  Does patient have children?: Yes How many children?: 1 How is patient's relationship with their children?: 50 yo son. "close with him. I share custody with his father."   Childhood History:  By whom was/is the patient raised?: Mother Additional childhood history information: parents divorced when pt was 3 "my dad abused drugs and alcohol and still does to this day."  Description of patient's relationship with caregiver when they were a child: close with mother; saw father very little Patient's description of current relationship with people who  raised him/her: close with mother; rarely talks to father. "It's not a bad relationship, we just rarely ever speak."  How were you disciplined when you got in trouble as a child/adolescent?: yelled at.  Does patient have siblings?: Yes Number of Siblings: 1 Description of patient's current relationship with siblings: brother-2 years older "we are very close." pt's brother has history of substance abuse  Did patient suffer any verbal/emotional/physical/sexual abuse as a child?: No Did patient suffer from severe childhood neglect?: No Has patient ever been sexually abused/assaulted/raped as an adolescent or adult?: No Was the patient ever a victim of a crime or a disaster?: No Witnessed domestic violence?: No Has patient been effected by domestic violence as an adult?: No  Education:  Highest grade of school patient has completed: Associates degree  Currently a student?: No Learning disability?: No  Employment/Work Situation:   Employment situation: Employed 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  What is the longest time patient has a held a job?: few years Where was the patient employed at that time?: see above.  Has patient ever been in the [employer]?: No Has patient ever served in combat?: No Did You Receive Any Psychiatric Treatment/Services While in the [employer]?: No Are There Guns or Other Weapons in Your Home?: No Are These Weapons Safely Secured?:  (n/a)  Financial Resources:   Financial resources: Income from employment, Private insurance Does patient have a representative payee or guardian?: No  Alcohol/Substance Abuse:   What has been your use  of drugs/alcohol within the last 12 months?: alcohol daily-mostly  weekends. drinks and uses cocaine at the same time. "very drunk alot."  If attempted suicide, did drugs/alcohol play a role in this?: No Alcohol/Substance Abuse Treatment Hx: Denies past history, Past Tx, Outpatient If yes, describe treatment: gets mental health meds from her PCP Dr. Kieth Brightly.  Has alcohol/substance abuse ever caused legal problems?: No  Social Support System:   Patient's Community Support System: Good Describe Community Support System: good supports in community and family Type of faith/religion: n/a  How does patient's faith help to cope with current illness?: n/a   Leisure/Recreation:   Leisure and Hobbies: spending time with her son. travel  Strengths/Needs:   What things does the patient do well?: intelligent; motivated to get help with substance abuse before losing job or more.  In what areas does patient struggle / problems for patient: family history of alcohol abuse; cravings; poor coping skills currently  Discharge Plan:   Does patient have access to transportation?: Yes Will patient be returning to same living situation after discharge?: Yes Currently receiving community mental health services: Yes (From Whom) (PCP-Dr. Beverely Low in [place]) If no, would patient like referral for services when discharged?: Yes (What county?) ([place]-referral for medication management and 1:1 therapy-pt not interested in S/A treatment. [facility].) Does patient have financial barriers related to discharge medications?: No (income and private insurance)  Summary/Recommendations:   Summary and Recommendations (to be completed by the evaluator): Patient is 31 yo female living in [place], [place] She presents to the hospital seeking treatment for passive SI, increased depression/anxiety, and for alcohol abuse/cocaine abuse. Patient is single, has a 83 yr old son whom she has 50% custody of, and is employed. Patient has  a supportive mother and brother. She denies SI/HI/AVH. Pt has a diagnosis of MDD and Alcohol Use Disorder/Cocaine Use Disorder. Recommendations for patient include: crisis stabilization, therapeutic milieu, encourage group attendance and participation, medication management for mood stabilization/detox, and development of comprehensive mental wellness/sobriety plan. CSW assessing for appropriate referrals.    Ledell Peoples Smart LCSW 07/08/2017 10:41 AM

## 2017-07-08 NOTE — BHH Group Notes (Signed)
[facility] Mental Health Association Group Therapy 07/08/2017 1:15pm  Type of Therapy: Mental Health Association Presentation  Participation Level: DID NOT ATTEND--pt was on phone with her HR regarding FMLA matters. Excused from group.   Summary of Progress/Problems: Mental Health Association [facility]) Speaker came to talk about his personal journey with mental health. The pt processed ways by which to relate to the speaker. MHA speaker provided handouts and educational information pertaining to groups and services offered by the [facility]. Pt was engaged in speaker's presentation and was receptive to resources provided.    [employer], LCSW 07/08/2017 3:11 PM

## 2017-07-08 NOTE — Plan of Care (Signed)
Problem: Education: Goal: Verbalization of understanding the information provided will improve Outcome: Progressing Patient verbalizes understanding of information, education provided.  Problem: Medication: Goal: Compliance with prescribed medication regimen will improve Outcome: Progressing Patient has been med compliant thus far.

## 2017-07-09 ENCOUNTER — Other Ambulatory Visit: Payer: Self-pay | Admitting: Psychiatry

## 2017-07-09 MED ORDER — LORATADINE 10 MG PO TABS
10.0000 mg | ORAL_TABLET | Freq: Every day | ORAL | Status: DC
  Administered 2017-07-09 – 2017-07-11 (×3): 10 mg via ORAL
  Filled 2017-07-09 (×6): qty 1

## 2017-07-09 NOTE — Progress Notes (Signed)
Pt attended AA group this evening.  

## 2017-07-09 NOTE — Progress Notes (Signed)
Nursing Note 07/09/2017 [id number]  Data Reports sleeping good without PRN sleep med.  Rates depression 0/10, hopelessness 0/10, and anxiety 0/10. Affect animated.  Denies HI, SI, AVH.  Patient refused Librium, naltrexone this AM.   Received orders to D/C these medicines.  States "I don't need them."  Verbalized desire to discharge today.   Action Spoke with patient 1:1, nurse offered support to patient throughout shift.  Continues to be monitored on 15 minute checks for safety.  Response Remains safe and appropriate on unit.

## 2017-07-09 NOTE — BHH Group Notes (Signed)
LCSW Group Therapy Note  07/09/2017 1:15pm  Type of Therapy and Topic:  Group Therapy:  Feelings around Relapse and Recovery  Participation Level:  Active   Description of Group:    Patients in this group will discuss emotions they experience before and after a relapse. They will process how experiencing these feelings, or avoidance of experiencing them, relates to having a relapse. Facilitator will guide patients to explore emotions they have related to recovery. Patients will be encouraged to process which emotions are more powerful. They will be guided to discuss the emotional reaction significant others in their lives may have to their relapse or recovery. Patients will be assisted in exploring ways to respond to the emotions of others without this contributing to a relapse.  Therapeutic Goals: 1. Patient will identify two or more emotions that lead to a relapse for them 2. Patient will identify two emotions that result when they relapse 3. Patient will identify two emotions related to recovery 4. Patient will demonstrate ability to communicate their needs through discussion and/or role plays   Summary of Patient Progress:  Allison Ewing was attentive and engaged during today's processing group. She shared her recent relapse with the group and identified things that she learned about herself through this experience. "I learned that I make excuses to myself and my family. I was in denial for a long time." Allison Ewing states she almost lost her job and damaged her relationship with family members due to her substance abuse. She continues to show progress in the group setting with improving insight.    Therapeutic Modalities:   Cognitive Behavioral Therapy Solution-Focused Therapy Assertiveness Training Relapse Prevention Therapy   Ledell Peoples Smart, LCSW 07/09/2017 1:07 PM

## 2017-07-09 NOTE — Tx Team (Signed)
Interdisciplinary Treatment and Diagnostic Plan Update  07/09/2017 Time of Session: 0830AM Allison Ewing MRN: [id number]  Principal Diagnosis: Substance induced mood disorder (HCC)  Secondary Diagnoses: Principal Problem:   Substance induced mood disorder (HCC) Active Problems:   Major depressive disorder, recurrent severe without psychotic features (HCC)   Current Medications:  Current Facility-Administered Medications  Medication Dose Route Frequency Provider Last Rate Last Dose  . acetaminophen (TYLENOL) tablet 650 mg  650 mg Oral Q6H PRN Charm Rings, NP      . alum & mag hydroxide-simeth (MAALOX/MYLANTA) 200-200-20 MG/5ML suspension 30 mL  30 mL Oral Q6H PRN Charm Rings, NP      . buPROPion [facility]) tablet 150 mg  150 mg Oral Daily Izediuno, Delight Ovens, MD   150 mg at 07/08/17 1323  . chlordiazePOXIDE (LIBRIUM) capsule 25 mg  25 mg Oral QID PRN Izediuno, Vincent A, MD      . chlordiazePOXIDE (LIBRIUM) capsule 25 mg  25 mg Oral BID Izediuno, Delight Ovens, MD   25 mg at 07/08/17 1323  . folic acid (FOLVITE) tablet 1 mg  1 mg Oral Daily Izediuno, Delight Ovens, MD   1 mg at 07/08/17 1323  . ibuprofen (ADVIL,MOTRIN) tablet 600 mg  600 mg Oral Q8H PRN Charm Rings, NP      . magnesium hydroxide (MILK OF MAGNESIA) suspension 30 mL  30 mL Oral Daily PRN Charm Rings, NP      . naltrexone (DEPADE) tablet 50 mg  50 mg Oral Daily Izediuno, Vincent A, MD   50 mg at 07/08/17 1323  . nicotine (NICODERM CQ - dosed in mg/24 hours) patch 14 mg  14 mg Transdermal Once Charm Rings, NP      . ondansetron [facility]) tablet 4 mg  4 mg Oral Q8H PRN Charm Rings, NP      . sertraline (ZOLOFT) tablet 50 mg  50 mg Oral QHS Charm Rings, NP   50 mg at 07/07/17 2104  . thiamine (VITAMIN B-1) tablet 100 mg  100 mg Oral Daily Izediuno, Delight Ovens, MD   100 mg at 07/08/17 1323   PTA Medications: Prescriptions Prior to Admission  Medication Sig Dispense Refill Last Dose  . buPROPion  (WELLBUTRIN) 75 MG tablet Take 1 tablet (75 mg total) by mouth 2 (two) times daily. (Patient taking differently: Take 75 mg by mouth at bedtime. ) 60 tablet 3 07/05/2017  . fluticasone (FLONASE) 50 MCG/ACT nasal spray Place 1 spray into both nostrils daily.    07/06/2017 at Unknown time  . loratadine (CLARITIN) 10 MG tablet Take 10 mg by mouth daily.    07/06/2017 at Unknown time  . sertraline (ZOLOFT) 50 MG tablet Take 50 mg by mouth at bedtime.   07/05/2017    Patient Stressors: Loss of boyfriend Occupational concerns Substance abuse  Patient Strengths: Ability for insight Active sense of humor Average or above average intelligence Capable of independent living General fund of knowledge Motivation for treatment/growth  Treatment Modalities: Medication Management, Group therapy, Case management,  1 to 1 session with clinician, Psychoeducation, Recreational therapy.   Physician Treatment Plan for Primary Diagnosis: Substance induced mood disorder (HCC) Long Term Goal(s): Improvement in symptoms so as ready for discharge Improvement in symptoms so as ready for discharge   Short Term Goals: Ability to identify changes in lifestyle to reduce recurrence of condition will improve Ability to verbalize feelings will improve Ability to disclose and discuss suicidal ideas Ability to demonstrate  self-control will improve Ability to identify and develop effective coping behaviors will improve Ability to maintain clinical measurements within normal limits will improve Compliance with prescribed medications will improve Ability to identify triggers associated with substance abuse/mental health issues will improve Ability to identify changes in lifestyle to reduce recurrence of condition will improve Ability to verbalize feelings will improve Ability to disclose and discuss suicidal ideas Ability to demonstrate self-control will improve Ability to identify and develop effective coping behaviors  will improve Ability to maintain clinical measurements within normal limits will improve Compliance with prescribed medications will improve Ability to identify triggers associated with substance abuse/mental health issues will improve  Medication Management: Evaluate patient's response, side effects, and tolerance of medication regimen.  Therapeutic Interventions: 1 to 1 sessions, Unit Group sessions and Medication administration.  Evaluation of Outcomes: Progressing  Physician Treatment Plan for Secondary Diagnosis: Principal Problem:   Substance induced mood disorder (HCC) Active Problems:   Major depressive disorder, recurrent severe without psychotic features (HCC)  Long Term Goal(s): Improvement in symptoms so as ready for discharge Improvement in symptoms so as ready for discharge   Short Term Goals: Ability to identify changes in lifestyle to reduce recurrence of condition will improve Ability to verbalize feelings will improve Ability to disclose and discuss suicidal ideas Ability to demonstrate self-control will improve Ability to identify and develop effective coping behaviors will improve Ability to maintain clinical measurements within normal limits will improve Compliance with prescribed medications will improve Ability to identify triggers associated with substance abuse/mental health issues will improve Ability to identify changes in lifestyle to reduce recurrence of condition will improve Ability to verbalize feelings will improve Ability to disclose and discuss suicidal ideas Ability to demonstrate self-control will improve Ability to identify and develop effective coping behaviors will improve Ability to maintain clinical measurements within normal limits will improve Compliance with prescribed medications will improve Ability to identify triggers associated with substance abuse/mental health issues will improve     Medication Management: Evaluate patient's  response, side effects, and tolerance of medication regimen.  Therapeutic Interventions: 1 to 1 sessions, Unit Group sessions and Medication administration.  Evaluation of Outcomes: Progressing   RN Treatment Plan for Primary Diagnosis: Substance induced mood disorder (HCC) Long Term Goal(s): Knowledge of disease and therapeutic regimen to maintain health will improve  Short Term Goals: Ability to demonstrate self-control, Ability to participate in decision making will improve and Ability to disclose and discuss suicidal ideas  Medication Management: RN will administer medications as ordered by provider, will assess and evaluate patient's response and provide education to patient for prescribed medication. RN will report any adverse and/or side effects to prescribing provider.  Therapeutic Interventions: 1 on 1 counseling sessions, Psychoeducation, Medication administration, Evaluate responses to treatment, Monitor vital signs and CBGs as ordered, Perform/monitor CIWA, COWS, AIMS and Fall Risk screenings as ordered, Perform wound care treatments as ordered.  Evaluation of Outcomes: Progressing   LCSW Treatment Plan for Primary Diagnosis: Substance induced mood disorder (HCC) Long Term Goal(s): Safe transition to appropriate next level of care at discharge, Engage patient in therapeutic group addressing interpersonal concerns.  Short Term Goals: Engage patient in aftercare planning with referrals and resources, Facilitate patient progression through stages of change regarding substance use diagnoses and concerns and Identify triggers associated with mental health/substance abuse issues  Therapeutic Interventions: Assess for all discharge needs, 1 to 1 time with Social worker, Explore available resources and support systems, Assess for adequacy in community support network, Educate  family and significant other(s) on suicide prevention, Complete Psychosocial Assessment, Interpersonal group  therapy.  Evaluation of Outcomes: Progressing   Progress in Treatment: Attending groups: Yes. Participating in groups: Yes. Taking medication as prescribed: Yes. Toleration medication: Yes. Family/Significant other contact made: No, will contact:  Allison Ewing's brother for collateral information and to complete SPE. Patient understands diagnosis: Yes. Discussing patient identified problems/goals with staff: Yes. Medical problems stabilized or resolved: Yes. Denies suicidal/homicidal ideation: Yes. Issues/concerns per patient self-inventory: No. Other: n/a   New problem(s) identified: No, Describe:  n/a  New Short Term/Long Term Goal(s): medication management for mood stabilization/detox, development of comprehensive mental wellness/sobriety plan.   Patient Goal: "to detox and get set up with an individual therapist."   Discharge Plan or Barriers: CSW assessing--Allison Ewing declined residential treatment and SAIOP. She is interested in outpatient medication management and counseling. [facility]. [facility] information and AA/NA list provided to Allison Ewing for additional community support.   Reason for Continuation of Hospitalization: Anxiety Depression Medication stabilization Withdrawal symptoms  Estimated Length of Stay: Monday, 07/12/17  Attendees: Patient: 07/09/2017 8:39 AM  Physician: Dr. Jackquline Berlin MD 07/09/2017 8:39 AM  Nursing: Aris Lot RN 07/09/2017 8:39 AM  RN Care Manager: Onnie Boer CM 07/09/2017 8:39 AM  Social Worker: Trula Slade, LCSW 07/09/2017 8:39 AM  Recreational Therapist: x 07/09/2017 8:39 AM  Other: Hillery Jacks NP; Feliz Beam Money NP 07/09/2017 8:39 AM  Other:  07/09/2017 8:39 AM  Other: 07/09/2017 8:39 AM    Scribe for Treatment Team: Ledell Peoples Smart, LCSW 07/09/2017 8:39 AM

## 2017-07-09 NOTE — BHH Suicide Risk Assessment (Signed)
BHH INPATIENT:  Family/Significant Other Suicide Prevention Education  Suicide Prevention Education:  Contact Attempts: Cristina Ceniceros (pt's brother) [phone] has been identified by the patient as the family member/significant other with whom the patient will be residing, and identified as the person(s) who will aid the patient in the event of a mental health crisis.  With written consent from the patient, two attempts were made to provide suicide prevention education, prior to and/or following the patient's discharge.  We were unsuccessful in providing suicide prevention education.  A suicide education pamphlet was given to the patient to share with family/significant other.  Date and time of first attempt: 07/08/17 at 4:12PM (voicemail not set up).  Date and time of second attempt: 07/09/17 at 11:19AM (voicemail not set up).  Albertine Lafoy N Smart LCSW 07/09/2017, 11:20 AM

## 2017-07-09 NOTE — Progress Notes (Signed)
Recreation Therapy Notes  Date: 07/09/17 Time: 0930 Location: [address] Dayroom  Group Topic: Stress Management  Goal Area(s) Addresses:  Patient will verbalize importance of using healthy stress management.  Patient will identify positive emotions associated with healthy stress management.   Behavioral Response: Engaged  Intervention: Stress Management  Activity :  Guided Imagery.  LRT introduced the stress management technique of guided imagery.  Patients were to listen and follow along as LRT read a script to lead patients on a mental vacation through a meadow.    Education:  Stress Management, Discharge Planning.   Education Outcome: Acknowledges edcuation/In group clarification offered/Needs additional education  Clinical Observations/Feedback: Pt attended group.    Caroll Rancher, LRT/CTRS         Lillia Abed, Tahj Lindseth A 07/09/2017 11:09 AM

## 2017-07-09 NOTE — Progress Notes (Signed)
[facility] MD Progress Note  07/09/2017 10:52 AM Allison Ewing  MRN:  [id number] Subjective:   31 y.o Caucasian female, single, lives alone, employed. Personal and family history of SUD. Presented in company of her family. Reports suicidal thoughts and worsening depression. Reports increased use of alcohol, cocaine and benzodiazepines. BAL was 139 mg/dl. UDS was positive for cocaine. Potassium was low at presentation. Vital signs showed autonomic instability at presentation.   Chart reviewed today. Patient discussed at team today.  Staff reports that patient has been appropriate on the unit. Patient has been interacting well with peers. No behavioral issues. Patient has not voiced any suicidal thoughts. Patient has not been observed to be internally stimulated. She refused Naltrexone this morning. She feels she does not need the medications. She refused librium this morning. Her vitals has been stable. She has opted to optimize Bupropion monotherapy only. CIWA scores are within normal limits.   Seen today. Says she is feeling good on her medications. She is in good spirits. Energy level is good. She slept well last night. She enjoys being around others. She eats normally. She is able to think clearly. She has been in touch with her son. Says she wished she could take him to his soccer game tomorrow. No suicidal thoughts. No homicidal thoughts. No evidence of mania. No evidence of psychosis. No side effects from her medications. Patient requested to be tested for chlamydia as she has been with her boyfriend recently.    Principal Problem: Substance induced mood disorder ([place]) Diagnosis:   Patient Active Problem List   Diagnosis Date Noted  . Substance induced mood disorder ([place]) [F19.94] 07/08/2017  . Alcohol abuse [F10.10] 07/07/2017  . Cocaine abuse [F14.10] 07/07/2017  . Major depressive disorder, recurrent severe without psychotic features ([place]) [F33.2] 07/07/2017  . Anxiety [F41.9] 04/07/2017   . S/P cesarean section [Z98.891] 08/06/2014   Total Time spent with patient: 20 minutes  Past Psychiatric History: As in H&P  Past Medical History:  Past Medical History:  Diagnosis Date  . Anxiety   . Anxiety   . Depression   . Vaginal Pap smear, abnormal     Past Surgical History:  Procedure Laterality Date  . CESAREAN SECTION N/A 08/06/2014   Procedure: CESAREAN SECTION;  Surgeon: Linda Hedges, DO;  Location: [facility];  Service: Obstetrics;  Laterality: N/A;  . DILATION AND CURETTAGE OF UTERUS  2007  . LASER ABLATION OF THE CERVIX    . LEEP     Family History:  Family History  Problem Relation Age of Onset  . Diabetes Maternal Aunt   . Aneurysm Maternal Grandmother   . Cancer Maternal Grandfather        lung  . Hypertension Paternal Grandmother   . Cancer Paternal Grandmother        cervical  . Stroke Paternal Grandmother   . Hyperlipidemia Mother   . Hypertension Mother   . Lung disease Father    Family Psychiatric  History: As in H&P Social History:  History  Alcohol Use  . 3.6 oz/week  . 6 Cans of beer per week    Comment: Weekly use -- BAC was 139 on admission     History  Drug Use  . Frequency: 4.0 times per week  . Types: Cocaine    Comment: 4x cocaine per week    Social History   Social History  . Marital status: Single    Spouse name: N/A  . Number of children: N/A  .  Years of education: N/A   Social History Main Topics  . Smoking status: Former Smoker    Packs/day: 1.00    Types: Cigarettes    Quit date: 04/07/2016  . Smokeless tobacco: Never Used  . Alcohol use 3.6 oz/week    6 Cans of beer per week     Comment: Weekly use -- BAC was 139 on admission  . Drug use: Yes    Frequency: 4.0 times per week    Types: Cocaine     Comment: 4x cocaine per week  . Sexual activity: Yes   Other Topics Concern  . None   Social History Narrative  . None   Additional Social History:      Sleep: Good  Appetite:  Good  Current  Medications: Current Facility-Administered Medications  Medication Dose Route Frequency Provider Last Rate Last Dose  . acetaminophen (TYLENOL) tablet 650 mg  650 mg Oral Q6H PRN Patrecia Pour, NP      . alum & mag hydroxide-simeth (MAALOX/MYLANTA) 200-200-20 MG/5ML suspension 30 mL  30 mL Oral Q6H PRN Patrecia Pour, NP      . buPROPion [facility]) tablet 150 mg  150 mg Oral Daily Artist Beach, MD   150 mg at 07/09/17 0842  . chlordiazePOXIDE (LIBRIUM) capsule 25 mg  25 mg Oral QID PRN Tobias Avitabile A, MD      . chlordiazePOXIDE (LIBRIUM) capsule 25 mg  25 mg Oral BID Shandora Koogler, Laruth Bouchard, MD   25 mg at 07/08/17 1323  . folic acid (FOLVITE) tablet 1 mg  1 mg Oral Daily Ronnett Pullin, Laruth Bouchard, MD   1 mg at 07/09/17 0842  . ibuprofen (ADVIL,MOTRIN) tablet 600 mg  600 mg Oral Q8H PRN Patrecia Pour, NP      . magnesium hydroxide (MILK OF MAGNESIA) suspension 30 mL  30 mL Oral Daily PRN Patrecia Pour, NP      . naltrexone (DEPADE) tablet 50 mg  50 mg Oral Daily Mikenzi Raysor A, MD   50 mg at 07/08/17 1323  . nicotine (NICODERM CQ - dosed in mg/24 hours) patch 14 mg  14 mg Transdermal Once Patrecia Pour, NP      . ondansetron [facility]) tablet 4 mg  4 mg Oral Q8H PRN Patrecia Pour, NP      . sertraline (ZOLOFT) tablet 50 mg  50 mg Oral QHS Patrecia Pour, NP   50 mg at 07/07/17 2104  . thiamine (VITAMIN B-1) tablet 100 mg  100 mg Oral Daily Arshdeep Bolger, Laruth Bouchard, MD   100 mg at 07/09/17 5701    Lab Results:  Results for orders placed or performed during the hospital encounter of 07/07/17 (from the past 48 hour(s))  Basic metabolic panel     Status: None   Collection Time: 07/08/17  6:29 PM  Result Value Ref Range   Sodium 139 135 - 145 mmol/L   Potassium 3.9 3.5 - 5.1 mmol/L   Chloride 105 101 - 111 mmol/L   CO2 26 22 - 32 mmol/L   Glucose, Bld 93 65 - 99 mg/dL   BUN 15 6 - 20 mg/dL   Creatinine, Ser 0.78 0.44 - 1.00 mg/dL   Calcium 8.9 8.9 - 10.3 mg/dL   GFR calc non Af  Amer >60 >60 mL/min   GFR calc Af Amer >60 >60 mL/min    Comment: (NOTE) The eGFR has been calculated using the CKD EPI equation. This calculation has not been validated  in all clinical situations. eGFR's persistently <60 mL/min signify possible Chronic Kidney Disease.    Anion gap 8 5 - 15    Comment: Performed at [facility], [place] [address], [place], [place] [address]    Blood Alcohol level:  Lab Results  Component Value Date   ETH 139 (H) 62/56/3893    Metabolic Disorder Labs: No results found for: HGBA1C, MPG No results found for: PROLACTIN Lab Results  Component Value Date   CHOL 145 05/16/2016   TRIG 76 05/16/2016   HDL 62 05/16/2016   LDLCALC 83 05/16/2016    Physical Findings: AIMS: Facial and Oral Movements Muscles of Facial Expression: None, normal Lips and Perioral Area: None, normal Jaw: None, normal Tongue: None, normal,Extremity Movements Upper (arms, wrists, hands, fingers): None, normal Lower (legs, knees, ankles, toes): None, normal, Trunk Movements Neck, shoulders, hips: None, normal, Overall Severity Severity of abnormal movements (highest score from questions above): None, normal Incapacitation due to abnormal movements: None, normal Patient's awareness of abnormal movements (rate only patient's report): No Awareness, Dental Status Current problems with teeth and/or dentures?: No Does patient usually wear dentures?: No  CIWA:    COWS:     Musculoskeletal: Strength & Muscle Tone: within normal limits Gait & Station: normal Patient leans: N/A  Psychiatric Specialty Exam: Physical Exam  Constitutional: She is oriented to person, place, and time. She appears well-developed and well-nourished.  HENT:  Head: Normocephalic and atraumatic.  Respiratory: Effort normal.  Neurological: She is alert and oriented to person, place, and time.  Psychiatric:  As above    ROS  Blood pressure 116/76, pulse 66, temperature 97.8 F  (36.6 C), temperature source Oral, resp. rate 16, height '5\' 3"'  (1.6 m), weight 46.3 kg (102 lb), last menstrual period 07/03/2017.Body mass index is 18.07 kg/m.  General Appearance: Neatly dressed. Warm and relates well. Appropriate behavior.   Eye Contact:  Good  Speech:  Spontaneous, normal prosody. Normal tone and rate.   Volume:  Normal  Mood:  Euthymic  Affect:  Appropriate and Full Range  Thought Process:  Goal Directed and Linear  Orientation:  Full (Time, Place, and Person)  Thought Content:  Future oriented. No delusional theme. No preoccupation with violent thoughts. No negative ruminations. No obsession.  No hallucination in any modality.   Suicidal Thoughts:  No  Homicidal Thoughts:  No  Memory:  Immediate;   Good Recent;   Good Remote;   Good  Judgement:  Good  Insight:  Good  Psychomotor Activity:  Normal  Concentration:  Concentration: Good and Attention Span: Good  Recall:  Good  Fund of Knowledge:  Good  Language:  Good  Akathisia:  Negative  Handed:    AIMS (if indicated):     Assets:  Communication Skills Desire for Improvement Financial Resources/Insurance Housing Intimacy Physical Health Resilience Social Support Transportation Vocational/Educational  ADL's:  Intact  Cognition:  WNL  Sleep:  Number of Hours: 6     Treatment Plan Summary: Patient is coming off multiple psychoactive substances smoothly. No complications so far. She has opted to withhold librium. No evidence of withdrawals. We plan to adhere with adjustment as as below. We would evaluate her further. Hopeful discharge over the weekend if she maintains stability.   Psychiatric: SIMD SUD  Medical:  Psychosocial:  Strain in her relationship Strain at work  PLAN: 1. Discontinue Sertraline 2. Discontinue Naltrexone 3. Continue to monitor mood, behavior and interaction  Artist Beach, MD 07/09/2017, 10:52 AM

## 2017-07-09 NOTE — Progress Notes (Signed)
D: Pt was in the dayroom playing cards with peers upon initial approach.  Pt presents with appropriate affect and mood.  She forwards little information to Clinical research associate.  Describe her day as "good" and denies having a goal.  She then made a goal to attend evening group.  Pt denies SI/HI, denies hallucinations, denies pain.  Pt has been visible in milieu interacting with peers appropriately.  Pt attended evening group.    A: Introduced self to pt.  Actively listened to pt and offered support and encouragement. Medication offered per order.  Q15 minute safety checks maintained.  R: Pt is safe on the unit.  Pt refused scheduled Zoloft because "they increased my Wellbutrin so I don't want to take the Zoloft too."    Pt verbally contracts for safety.  Will continue to monitor and assess.

## 2017-07-09 NOTE — Progress Notes (Signed)
Adult Psychoeducational Group Note  Date:  07/09/2017 Time:  10:43 AM  Group Topic/Focus:  Healthy Communication:   The focus of this group is to discuss communication, barriers to communication, as well as healthy ways to communicate with others.  Participation Level:  Active  Participation Quality:  Appropriate  Affect:  Appropriate  Cognitive:  Alert  Insight: Improving  Engagement in Group:  Engaged  Modes of Intervention:  Activity  Additional Comments:  Pt did participate in all group activities today.  Adriana Quinby R Jacquel Mccamish 07/09/2017, 10:43 AM

## 2017-07-09 NOTE — Progress Notes (Signed)
D: Pt was in the dayroom upon initial approach.  Pt presents with anxious affect and mood.  She remains focused on discharge.  Reports her day was "good" and her goal was "to go home."  She reports she is discharging "Sunday morning" and she feels safe to do so.  Pt denies SI/HI, denies hallucinations, denies pain.  Pt has been visible in milieu interacting with peers and staff appropriately.  Pt attended evening group.    A: Introduced self to pt.  Actively listened to pt and offered support and encouragement. Q15 minute safety checks maintained.  R: Pt is safe on the unit.  Pt verbally contracts for safety.  Will continue to monitor and assess.

## 2017-07-09 NOTE — Plan of Care (Signed)
Problem: Activity: Goal: Sleeping patterns will improve Outcome: Progressing Slept 6.75 hours last night according to flowsheet.    

## 2017-07-09 NOTE — Progress Notes (Signed)
  [facility] Discharge Plan :  Will you be returning to the same living situation after discharge:  Yes,  home At discharge, do you have transportation home?: Yes,  pt scheduled for discharge on Sunday, 07/11/17 Do you have the ability to pay for your medications: Yes,  [facility] insurance  Release of information consent forms completed and submitted to medical records by CSW.  Patient to Follow up at: Follow-up Information    [facility] up on 07/20/2017.   Why:  Assessment for therapy and medication management at 9:00AM with Cam Hines. Please bring insurance card to this appt. PLEASE CALL AT DISCHARGE TO PAY $20 DEPOSIT REQUIRED TO HOLD APPT. Thank you.  Contact information: [address] [place] [address] [phone]        [facility] Follow up on 09/22/2017.   Why:  Medication management appointment on this date at 11:00AM. Thank you.  Contact information: [address] [place] [address] Phone: [phone] Fax: [phone]       Sheliah Hatch, MD Follow up on 07/13/2017.   Specialty:  Family Medicine Why:  Hospital follow-up at 11:00AM with Esmond Camper PA. Thank you.  Contact information: [address] [place] [address] [phone]           Next level of care provider has access to [facility] Link:no  Safety Planning and Suicide Prevention discussed: Yes,  SPE completed with pt; contact attempts made with pt's brother. SPI pamphlet and Mobile Crisis information provided.  Have you used any form of tobacco in the last 30 days? (Cigarettes, Smokeless Tobacco, Cigars, and/or Pipes): Yes  Has patient been referred to the Quitline?: Patient refused referral  Patient has been referred for addiction treatment: Yes  [employer], LCSW 07/09/2017, 11:21 AM

## 2017-07-10 MED ORDER — TRAZODONE HCL 50 MG PO TABS
50.0000 mg | ORAL_TABLET | Freq: Every evening | ORAL | Status: DC | PRN
  Administered 2017-07-10: 50 mg via ORAL
  Filled 2017-07-10: qty 1

## 2017-07-10 NOTE — Progress Notes (Signed)
D: Patient denies any depressive symptoms today.  She denies minimal withdrawal symptoms.  She is sleeping and eating well; her energy level is normal and her concentration is good.  She is hoping for discharge tomorrow.  She has been an active participant in group activities.  She denies any thoughts of self harm.  She is bright upon approach with pleasant and stable mood.  She is concerned about her lab result for STD and hopes the result will return before discharge.  Informed patient she could call and give her code number to get results.  Patient's follow up in listed in chart by social worker. A: Continue to monitor medication management and MD orders.  Safety checks completed every 15 minutes per protocol.  Offer support and encouragement as needed. R: Patient is receptive to staff; her behavior is appropriate.

## 2017-07-10 NOTE — Progress Notes (Signed)
[facility] MD Progress Note  07/10/2017 1:30 PM Allison Ewing  MRN:  [id number] Subjective:   31 y.o Caucasian female, single, lives alone, employed. Personal and family history of SUD. Presented in company of her family. Reports suicidal thoughts and worsening depression. Reports increased use of alcohol, cocaine and benzodiazepines. BAL was 139 mg/dl. UDS was positive for cocaine. Potassium was low at presentation. Vital signs showed autonomic instability at presentation.   Chart reviewed today. Patient discussed at team today.  Staff reports that has been appropriate on the unit. No behavioral issues. She has not voiced any futility thoughts. She is looking forward to discharge tomorrow.  Seen today. No evidence of depression. No evidence of mania. No evidence of psychosis. She is no longer having any suicidal thoughts. No thoughts of violence. She is tolerating her medications well. Futuristic plans about her family and work.    Principal Problem: Substance induced mood disorder ([place]) Diagnosis:   Patient Active Problem List   Diagnosis Date Noted  . Substance induced mood disorder ([place]) [F19.94] 07/08/2017  . Alcohol abuse [F10.10] 07/07/2017  . Cocaine abuse [F14.10] 07/07/2017  . Major depressive disorder, recurrent severe without psychotic features ([place]) [F33.2] 07/07/2017  . Anxiety [F41.9] 04/07/2017  . S/P cesarean section [Z98.891] 08/06/2014   Total Time spent with patient: 20 minutes  Past Psychiatric History: As in H&P  Past Medical History:  Past Medical History:  Diagnosis Date  . Anxiety   . Anxiety   . Depression   . Vaginal Pap smear, abnormal     Past Surgical History:  Procedure Laterality Date  . CESAREAN SECTION N/A 08/06/2014   Procedure: CESAREAN SECTION;  Surgeon: Linda Hedges, DO;  Location: [facility];  Service: Obstetrics;  Laterality: N/A;  . DILATION AND CURETTAGE OF UTERUS  2007  . LASER ABLATION OF THE CERVIX    . LEEP     Family History:  Family  History  Problem Relation Age of Onset  . Diabetes Maternal Aunt   . Aneurysm Maternal Grandmother   . Cancer Maternal Grandfather        lung  . Hypertension Paternal Grandmother   . Cancer Paternal Grandmother        cervical  . Stroke Paternal Grandmother   . Hyperlipidemia Mother   . Hypertension Mother   . Lung disease Father    Family Psychiatric  History: As in H&P Social History:  History  Alcohol Use  . 3.6 oz/week  . 6 Cans of beer per week    Comment: Weekly use -- BAC was 139 on admission     History  Drug Use  . Frequency: 4.0 times per week  . Types: Cocaine    Comment: 4x cocaine per week    Social History   Social History  . Marital status: Single    Spouse name: N/A  . Number of children: N/A  . Years of education: N/A   Social History Main Topics  . Smoking status: Former Smoker    Packs/day: 1.00    Types: Cigarettes    Quit date: 04/07/2016  . Smokeless tobacco: Never Used  . Alcohol use 3.6 oz/week    6 Cans of beer per week     Comment: Weekly use -- BAC was 139 on admission  . Drug use: Yes    Frequency: 4.0 times per week    Types: Cocaine     Comment: 4x cocaine per week  . Sexual activity: Yes   Other Topics  Concern  . None   Social History Narrative  . None   Additional Social History:      Sleep: Good  Appetite:  Good  Current Medications: Current Facility-Administered Medications  Medication Dose Route Frequency Provider Last Rate Last Dose  . acetaminophen (TYLENOL) tablet 650 mg  650 mg Oral Q6H PRN Patrecia Pour, NP      . alum & mag hydroxide-simeth (MAALOX/MYLANTA) 200-200-20 MG/5ML suspension 30 mL  30 mL Oral Q6H PRN Patrecia Pour, NP      . buPROPion [facility]) tablet 150 mg  150 mg Oral Daily Artist Beach, MD   150 mg at 07/10/17 0807  . chlordiazePOXIDE (LIBRIUM) capsule 25 mg  25 mg Oral QID PRN Izediuno, Laruth Bouchard, MD      . folic acid (FOLVITE) tablet 1 mg  1 mg Oral Daily Izediuno, Laruth Bouchard,  MD   1 mg at 07/10/17 0807  . ibuprofen (ADVIL,MOTRIN) tablet 600 mg  600 mg Oral Q8H PRN Patrecia Pour, NP      . loratadine (CLARITIN) tablet 10 mg  10 mg Oral Daily Lindell Spar I, NP   10 mg at 07/10/17 0807  . magnesium hydroxide (MILK OF MAGNESIA) suspension 30 mL  30 mL Oral Daily PRN Patrecia Pour, NP      . nicotine (NICODERM CQ - dosed in mg/24 hours) patch 14 mg  14 mg Transdermal Once Patrecia Pour, NP      . ondansetron [facility]) tablet 4 mg  4 mg Oral Q8H PRN Patrecia Pour, NP      . thiamine (VITAMIN B-1) tablet 100 mg  100 mg Oral Daily Izediuno, Laruth Bouchard, MD   100 mg at 07/10/17 0807    Lab Results:  Results for orders placed or performed during the hospital encounter of 07/07/17 (from the past 48 hour(s))  Basic metabolic panel     Status: None   Collection Time: 07/08/17  6:29 PM  Result Value Ref Range   Sodium 139 135 - 145 mmol/L   Potassium 3.9 3.5 - 5.1 mmol/L   Chloride 105 101 - 111 mmol/L   CO2 26 22 - 32 mmol/L   Glucose, Bld 93 65 - 99 mg/dL   BUN 15 6 - 20 mg/dL   Creatinine, Ser 0.78 0.44 - 1.00 mg/dL   Calcium 8.9 8.9 - 10.3 mg/dL   GFR calc non Af Amer >60 >60 mL/min   GFR calc Af Amer >60 >60 mL/min    Comment: (NOTE) The eGFR has been calculated using the CKD EPI equation. This calculation has not been validated in all clinical situations. eGFR's persistently <60 mL/min signify possible Chronic Kidney Disease.    Anion gap 8 5 - 15    Comment: Performed at [facility], [place] [address], [place], [place] [address]    Blood Alcohol level:  Lab Results  Component Value Date   ETH 139 (H) 70/96/2836    Metabolic Disorder Labs: No results found for: HGBA1C, MPG No results found for: PROLACTIN Lab Results  Component Value Date   CHOL 145 05/16/2016   TRIG 76 05/16/2016   HDL 62 05/16/2016   LDLCALC 83 05/16/2016    Physical Findings: AIMS: Facial and Oral Movements Muscles of Facial Expression: None,  normal Lips and Perioral Area: None, normal Jaw: None, normal Tongue: None, normal,Extremity Movements Upper (arms, wrists, hands, fingers): None, normal Lower (legs, knees, ankles, toes): None, normal, Trunk Movements Neck, shoulders,  hips: None, normal, Overall Severity Severity of abnormal movements (highest score from questions above): None, normal Incapacitation due to abnormal movements: None, normal Patient's awareness of abnormal movements (rate only patient's report): No Awareness, Dental Status Current problems with teeth and/or dentures?: No Does patient usually wear dentures?: No  CIWA:    COWS:     Musculoskeletal: Strength & Muscle Tone: within normal limits Gait & Station: normal Patient leans: N/A  Psychiatric Specialty Exam: Physical Exam  Constitutional: She is oriented to person, place, and time. She appears well-developed and well-nourished.  HENT:  Head: Normocephalic and atraumatic.  Respiratory: Effort normal.  Neurological: She is alert and oriented to person, place, and time.  Psychiatric:  As above    ROS  Blood pressure 115/82, pulse 65, temperature 97.8 F (36.6 C), temperature source Oral, resp. rate 16, height '5\' 3"'  (1.6 m), weight 46.3 kg (102 lb), last menstrual period 07/03/2017.Body mass index is 18.07 kg/m.  General Appearance: Neatly dressed, pleasant, engaging well and cooperative. Appropriate behavior. Not in any distress. Good relatedness. Not internally stimulated  Eye Contact:  Good  Speech:  Spontaneous, normal prosody. Normal tone and rate.   Volume:  Normal  Mood:  Euthymic  Affect:  Appropriate and Full Range  Thought Process:  Goal Directed and Linear  Orientation:  Full (Time, Place, and Person)  Thought Content:  Future oriented. No delusional theme. No preoccupation with violent thoughts. No negative ruminations. No obsession.  No hallucination in any modality.   Suicidal Thoughts:  No  Homicidal Thoughts:  No  Memory:   Immediate;   Good Recent;   Good Remote;   Good  Judgement:  Good  Insight:  Good  Psychomotor Activity:  Normal  Concentration:  Concentration: Good and Attention Span: Good  Recall:  Good  Fund of Knowledge:  Good  Language:  Good  Akathisia:  Negative  Handed:    AIMS (if indicated):     Assets:  Communication Skills Desire for Improvement Financial Resources/Insurance Housing Intimacy Physical Health Resilience Social Support Transportation Vocational/Educational  ADL's:  Intact  Cognition:  WNL  Sleep:  Number of Hours: 6.75     Treatment Plan Summary: Patient has completely come off psychoactive substances. No dangerousness. No evidence of any enduring severe mental illness at this time. We would evaluate her overnight. Hopeful discharge tomorrow if she maintains stability.    Psychiatric: SIMD SUD  Medical:  Psychosocial:  Strain in her relationship Strain at work  PLAN: 1. Continue current regimen. 2. Continue to monitor mood, behavior and interaction  Artist Beach, MD 07/10/2017, 1:30 PMPatient ID: Allison Ewing, female   DOB: 1986-09-28, 31 y.o.   MRN: [id number]

## 2017-07-10 NOTE — Plan of Care (Signed)
Problem: Safety: Goal: Periods of time without injury will increase Outcome: Progressing Pt has not harmed self or others tonight.  She denies SI/HI and verbally contracts for safety.    

## 2017-07-10 NOTE — [facility] Group Notes (Signed)
[facility] Group Notes:  (Nursing/MHT/Case Management/Adjunct)  Date:  07/10/2017  Time:  0915 a.m.  Type of Therapy:  Nursing Group  Participation Level:  Minimal  Participation Quality:  Appropriate  Affect:  Appropriate  Cognitive:  Alert  Insight:  Lacking  Engagement in Group:  Resistant  Modes of Intervention:  Support  Summary of Progress/Problems: Patient was having a side conversation with her peers.  Didn't appear to be engaged.   Cranford Mon 07/10/2017, 1:23 PM

## 2017-07-10 NOTE — Progress Notes (Signed)
[facility] Group Notes:  (Nursing/MHT/Case Management/Adjunct)  Date:  07/10/2017  Time:  11:19 PM  Type of Therapy:  Psychoeducational Skills  Participation Level:  Active  Participation Quality:  Attentive  Affect:  Appropriate  Cognitive:  Appropriate  Insight:  Appropriate  Engagement in Group:  Developing/Improving  Modes of Intervention:  Education  Summary of Progress/Problems: Patient states she met her new peers today and that she was pleased with the fact that she went outside. She branched out by talking to some of her peers on another hallway. As for the theme of the day, her coping skill will be to get more exercise.   Archie Balboa S 07/10/2017, 11:19 PM

## 2017-07-10 NOTE — [facility] Group Notes (Signed)
LCSW Group Therapy Note  Date/Time:  07/10/2017   1:15-2:15PM  Type of Therapy and Topic:  Group Therapy:  Fears and Unhealthy/Healthy Coping Skills  Participation Level:  Active   Description of Group:  The focus of this group was to discuss some of the prevalent fears that patients experience, and to identify the commonalities among group members.  An exercise was used to initiate the discussion, followed by writing on the white board a group-generated list of unhealthy coping and healthy coping techniques to deal with each fear.    Therapeutic Goals: 1. Patient will be able to distinguish between healthy and unhealthy coping skills 2. Patient will identify and describe 3 fears they experience 3. Patient will identify one positive coping strategy for each fear they experience 4. Patient will respond empathetically to peers' statements regarding fears they experience  Summary of Patient Progress:  The patient expressed a fear of knowing what to do with her free time and thus relapsing due to boredom.  Therapeutic Modalities Cognitive Behavioral Therapy Motivational Interviewing  Ambrose Mantle, LCSW

## 2017-07-11 MED ORDER — TRAZODONE HCL 50 MG PO TABS: 50.0000 mg | ORAL_TABLET | Freq: Every evening | ORAL | 0 refills | Status: DC | PRN

## 2017-07-11 MED ORDER — BUPROPION HCL 75 MG PO TABS: 150.0000 mg | ORAL_TABLET | Freq: Every day | ORAL | 0 refills | Status: DC

## 2017-07-11 MED ORDER — NICOTINE 14 MG/24HR TD PT24: 14.0000 mg | MEDICATED_PATCH | Freq: Once | TRANSDERMAL | 0 refills | Status: AC

## 2017-07-11 NOTE — Plan of Care (Signed)
Problem: Activity: Goal: Interest or engagement in activities will improve Outcome: Progressing Pt has been visible in milieu interacting with peers appropriately.  She attended evening group tonight.     

## 2017-07-11 NOTE — Progress Notes (Signed)
Patient ID: Allison Ewing, female   DOB: 1986-07-14, 31 y.o.   MRN: [id number]   Pt was discharged, she reported that her car was at [facility]. Pt reported that she was ready for discharge and that her only concern was her FMLA paperwork. This Clinical research associate spoke with Santina Evans SW she reported that she had no paperwork, Dr. Jackquline Berlin reported that he completed and gave back to [place] [facility] SW. The patient was given Herbert Seta SW number to follow up with on Monday. Pt reported that her depression was a 0, her hopelessness was a 0, and her anxiety was a 0. Pt reported being negative SI/HI, no AH/VH noted.
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
## 2017-07-11 NOTE — Progress Notes (Signed)
D: Pt was in the dayroom upon initial approach.  Pt presents with anxious affect and mood.  Describes her day as "good" and reports goal was to "stay positive."  When asked if she met her goal, she states "yeah, of course."  She reports she is discharging tomorrow and she feels safe to do so.  Pt denies SI/HI, denies hallucinations, denies pain.  Pt has been visible in milieu interacting with peers and staff appropriately.  Pt attended evening group.  Pt requested medication for sleep tonight.    A: Introduced self to pt.  Actively listened to pt and offered support and encouragement. On-site provider notified of pt request for sleep medication and Trazodone 50 mg PO PRN for sleep was ordered and administered.  Q15 minute safety checks maintained.  R: Pt is safe on the unit.  Pt is compliant with medication.  Pt verbally contracts for safety.  Will continue to monitor and assess.

## 2017-07-11 NOTE — [facility] Group Notes (Signed)
[facility] LCSW Group Therapy Note    10:15 to 11 AM   Type of Therapy and Topic: Group Therapy: Feelings Around Returning Home & Establishing a Supportive Framework and Discussion of Self Care  Participation Level: Active    Description of Group:  Patients first processed thoughts and feelings about up coming discharge. These included fears of upcoming changes, lack of change, new living environments, judgements and expectations from others and overall stigma of MH issues. We then discussed what is a supportive framework? What does it look like feel like and how do I discern it from and unhealthy non-supportive network? Learn how to cope when supports are not helpful and don't support you. Discuss what to do when your family/friends are not supportive.   Therapeutic Goals Addressed in Processing Group:  1. Patient will identify one healthy supportive network that they can use at discharge. 2. Patient will identify one factor of a supportive framework and how to tell it from an unhealthy network. 3. Patient able to identify one coping skill to use when they do not have positive supports from others. 4. Patient will demonstrate ability to communicate their needs through discussion and/or role plays.  Summary of Patient Progress:  Pt engaged easily during group session. As patients processed their anxiety about discharge and described healthy supports patient shared with others how they had been helpful before leaving group at midpoint.    Carney Bern, LCSW

## 2017-07-11 NOTE — [facility] Suicide Risk Assessment (Signed)
[facility] Discharge Suicide Risk Assessment   Principal Problem: Substance induced mood disorder [place]) Discharge Diagnoses:  Patient Active Problem List   Diagnosis Date Noted  . Substance induced mood disorder (HCC) [F19.94] 07/08/2017  . Alcohol abuse [F10.10] 07/07/2017  . Cocaine abuse [F14.10] 07/07/2017  . Major depressive disorder, recurrent severe without psychotic features (HCC) [F33.2] 07/07/2017  . Anxiety [F41.9] 04/07/2017  . S/P cesarean section [Z98.891] 08/06/2014    Total Time spent with patient: 30 minutes  Musculoskeletal: Strength & Muscle Tone: within normal limits Gait & Station: normal Patient leans: N/A  Psychiatric Specialty Exam: Review of Systems  Constitutional: Negative.   HENT: Negative.   Eyes: Negative.   Respiratory: Negative.   Cardiovascular: Negative.   Gastrointestinal: Negative.   Genitourinary: Negative.   Musculoskeletal: Negative.   Skin: Negative.   Neurological: Negative.   Endo/Heme/Allergies: Negative.   Psychiatric/Behavioral: Negative for depression, hallucinations, memory loss and suicidal ideas. The patient is not nervous/anxious and does not have insomnia.     Blood pressure 101/67, pulse 74, temperature 98.6 F (37 C), temperature source Oral, resp. rate 16, height  (1.6 m), weight 46.3 kg (102 lb), last menstrual period 07/03/2017.Body mass index is 18.07 kg/m.  General Appearance: Neatly dressed, pleasant, engaging well and cooperative. Appropriate behavior. Not in any distress. Good relatedness. Not internally stimulated  Eye Contact::  Good  Speech:  Spontaneous, normal prosody. Normal tone and rate.   Volume:  Normal  Mood:  Euthymic  Affect:  Appropriate and Full Range  Thought Process:  Goal Directed and Linear  Orientation:  Full (Time, Place, and Person)  Thought Content:  No delusional theme. No preoccupation with violent thoughts. No negative ruminations. No obsession.  No hallucination in any modality.    Suicidal Thoughts:  No  Homicidal Thoughts:  No  Memory:  Immediate;   Good Recent;   Good Remote;   Good  Judgement:  Good  Insight:  Good  Psychomotor Activity:  Normal  Concentration:  Good  Recall:  Good  Fund of Knowledge:Good  Language: Good  Akathisia:  Negative  Handed:    AIMS (if indicated):     Assets:  Communication Skills Desire for Improvement Financial Resources/Insurance Housing Intimacy Physical Health Resilience Social Support Talents/Skills Transportation Vocational/Educational  Sleep:  Number of Hours: 6.75  Cognition: WNL  ADL's:  Intact   Clinical Assessment::   31 y.o Caucasian female, single, lives alone, employed. Personal and family history of SUD. Presented in company of her family. Reports suicidal thoughts and worsening depression. Reports increased use of alcohol, cocaine and benzodiazepines. BAL was 139 mg/dl. UDS was positive for cocaine. Potassium was low at presentation. Vital signs showed autonomic instability at presentation.   Seen today. Reports that she is in good spirits. Not feeling depressed. Reports normal energy and interest. Has been maintaining normal biological functions. She is able to think clearly. She is able to focus on task. Her thoughts are not crowded or racing. No evidence of mania. No hallucination in any modality. She is not making any delusional statement. No passivity of will/thought. She is fully in touch with reality. No thoughts of suicide. No thoughts of homicide. No violent thoughts. No overwhelming anxiety. No access to weapons, no past suicidal behavior.   Nursing staff reports that patient has been appropriate on the unit. Patient has been interacting well with peers. No behavioral issues. Patient has not voiced any suicidal thoughts. Patient has not been observed to be internally stimulated. Patient has  been adherent with treatment recommendations. Patient has been tolerating their medication well.  Patient  was discussed at team. Team members feels that patient is back to her baseline level of function. Team agrees with plan to discharge patient today.   Demographic Factors:  NA  Loss Factors: NA  Historical Factors: Impulsivity  Risk Reduction Factors:   Responsible for children under 79 years of age, Sense of responsibility to family, Employed, Living with another person, especially a relative, Positive social support, Positive therapeutic relationship and Positive coping skills or problem solving skills  Continued Clinical Symptoms:  As above  Cognitive Features That Contribute To Risk:  None    Suicide Risk:  Minimal: No identifiable suicidal ideation.  Patient is not having any thoughts of suicide at this time. Modifiable risk factors targeted during this admission includes mood swings and substance use. Demographical and historical risk factors cannot be modified. Patient is now engaging well. Patient is reliable and is future oriented. We have buffered patient's support structures. At this point, patient is at low risk of suicide. Patient is aware of the effects of psychoactive substances on decision making process. Patient has been provided with emergency contacts. Patient acknowledges to use resources provided if unforseen circumstances changes their current risk stratification.    Follow-up Information    [facility] Follow up on 07/20/2017.   Why:  Assessment for therapy and medication management at 9:00AM with Cam Hines. Please bring insurance card to this appt. PLEASE CALL AT DISCHARGE TO PAY $20 DEPOSIT REQUIRED TO HOLD APPT. Thank you.  Contact information: [address] [place] [address] [phone]        [facility] Treatment Follow up on 09/22/2017.   Why:  Medication management appointment on this date at 11:00AM. Thank you.  Contact information: [address] [place] [address] Phone: [phone] Fax: [phone]        Sheliah Hatch, MD Follow up on 07/13/2017.   Specialty:  Family Medicine Why:  Hospital follow-up at 11:00AM with Esmond Camper PA. Thank you.  Contact information: [address] [place] [address] [phone]           Plan Of Care/Follow-up recommendations:  1. Continue current psychotropic medications 2. Mental health and addiction follow up as arranged.  3. Provided limited quantity of prescriptions   Georgiann Cocker, MD 07/11/2017, 9:56 AM

## 2017-07-13 ENCOUNTER — Ambulatory Visit (INDEPENDENT_AMBULATORY_CARE_PROVIDER_SITE_OTHER): Payer: 59 | Admitting: Physician Assistant

## 2017-07-13 ENCOUNTER — Encounter: Payer: Self-pay | Admitting: Physician Assistant

## 2017-07-13 VITALS — BP 128/89 | HR 88 | Temp 97.9°F | Resp 16 | Ht 64.0 in | Wt 105.5 lb

## 2017-07-13 DIAGNOSIS — F329 Major depressive disorder, single episode, unspecified: Secondary | ICD-10-CM

## 2017-07-13 DIAGNOSIS — F1994 Other psychoactive substance use, unspecified with psychoactive substance-induced mood disorder: Secondary | ICD-10-CM

## 2017-07-13 DIAGNOSIS — F32A Depression, unspecified: Secondary | ICD-10-CM

## 2017-07-13 LAB — URINE CYTOLOGY ANCILLARY ONLY
CHLAMYDIA, DNA PROBE: NEGATIVE
Neisseria Gonorrhea: NEGATIVE

## 2017-07-13 MED ORDER — BUPROPION HCL ER (XL) 150 MG PO TB24: 150.0000 mg | ORAL_TABLET | Freq: Every day | ORAL | 0 refills | Status: DC

## 2017-07-13 MED ORDER — TRAZODONE HCL 50 MG PO TABS: 50.0000 mg | ORAL_TABLET | Freq: Every evening | ORAL | 0 refills | Status: DC | PRN

## 2017-07-13 MED FILL — buPROPion HCL ER (XL) 150 M: 150 | 30 days supply | Qty: 30 | Fill #0

## 2017-07-13 MED FILL — traZODone HCL 50 MG TABS: 50 | 30 days supply | Qty: 30 | Fill #0

## 2017-07-13 NOTE — Progress Notes (Signed)
Patient presents to clinic today for hospital follow-up. Patient presented to ER on 07/07/17 for cocaine and alcohol use associated with depressed mood and suicidal ideation. Workup included CT Head (negative for ICH or fracture), EKG (negative) and Labs revealing mild hypokalemia. Patient was subsequently admitted to [facility] for further assessment and management. Psychiatry felt patient suffered from a substance induced mood disorder. Patient discharged on Wellbutrin 75 mg (150 mg once daily) and Trazodone 50 mg nightly to help with sleep.   Since discharge, patient endorses doing very well. Is taking medications as directed. Denies any alcohol consumption since discharge. States she has had not cocaine use or use of other illicit substance since discharge. She is avoiding bad influences in her life. Also states that alcohol use is what leads to other poor choices so by avoiding alcohol, she is avoiding other things. Is taking medications as directed. Endorses doing very well. Denies SI/HI.   Past Medical History:  Diagnosis Date  . Anxiety   . Anxiety   . Depression   . Vaginal Pap smear, abnormal     Current Outpatient Prescriptions on File Prior to Visit  Medication Sig Dispense Refill  . buPROPion (WELLBUTRIN) 75 MG tablet Take 2 tablets (150 mg total) by mouth daily. 60 tablet 0  . loratadine (CLARITIN) 10 MG tablet Take 10 mg by mouth daily.     . traZODone (DESYREL) 50 MG tablet Take 1 tablet (50 mg total) by mouth at bedtime as needed for sleep. 30 tablet 0   No current facility-administered medications on file prior to visit.     No Known Allergies  Family History  Problem Relation Age of Onset  . Diabetes Maternal Aunt   . Aneurysm Maternal Grandmother   . Cancer Maternal Grandfather        lung  . Hypertension Paternal Grandmother   . Cancer Paternal Grandmother        cervical  . Stroke Paternal Grandmother   . Hyperlipidemia Mother   . Hypertension Mother    . Lung disease Father     Social History   Social History  . Marital status: Single    Spouse name: N/A  . Number of children: N/A  . Years of education: N/A   Social History Main Topics  . Smoking status: Former Smoker    Packs/day: 1.00    Types: Cigarettes    Quit date: 04/07/2016  . Smokeless tobacco: Never Used  . Alcohol use 3.6 oz/week    6 Cans of beer per week     Comment: Weekly use -- BAC was 139 on admission  . Drug use: Yes    Frequency: 4.0 times per week    Types: Cocaine     Comment: 4x cocaine per week  . Sexual activity: Yes   Other Topics Concern  . None   Social History Narrative  . None    Review of Systems - See HPI.  All other ROS are negative.  BP 128/89   Pulse 88   Temp 97.9 F (36.6 C) (Oral)   Resp 16   Ht _0  (1.626 m)   Wt 105 lb 8 oz (47.9 kg)   LMP 07/03/2017   SpO2 98%   BMI 18.11 kg/m   Physical Exam  Constitutional: She is oriented to person, place, and time and well-developed, well-nourished, and in no distress.  HENT:  Head: Normocephalic and atraumatic.  Right Ear: External ear normal.  Left Ear: External ear normal.  Nose: Nose normal.  Mouth/Throat: Oropharynx is clear and moist. No oropharyngeal exudate.  Eyes: Conjunctivae are normal.  Neck: Neck supple.  Cardiovascular: Normal rate, regular rhythm, normal heart sounds and intact distal pulses.   Pulmonary/Chest: Effort normal and breath sounds normal.  Abdominal: Soft. Bowel sounds are normal. She exhibits no distension. There is no tenderness.  Neurological: She is alert and oriented to person, place, and time. No cranial nerve deficit.  Skin: Skin is warm and dry. No rash noted.  Psychiatric: Affect normal.  Vitals reviewed.   Recent Results (from the past 2160 hour(s))  Rapid urine drug screen (hospital performed)     Status: Abnormal   Collection Time: 07/07/17  8:45 AM  Result Value Ref Range   Opiates NONE DETECTED NONE DETECTED   Cocaine  POSITIVE (A) NONE DETECTED   Benzodiazepines NONE DETECTED NONE DETECTED   Amphetamines NONE DETECTED NONE DETECTED   Tetrahydrocannabinol NONE DETECTED NONE DETECTED   Barbiturates NONE DETECTED NONE DETECTED    Comment:        DRUG SCREEN FOR MEDICAL PURPOSES ONLY.  IF CONFIRMATION IS NEEDED FOR ANY PURPOSE, NOTIFY LAB WITHIN 5 DAYS.        LOWEST DETECTABLE LIMITS FOR URINE DRUG SCREEN Drug Class       Cutoff (ng/mL) Amphetamine      1000 Barbiturate      200 Benzodiazepine   465 Tricyclics       681 Opiates          300 Cocaine          300 THC              50   Comprehensive metabolic panel     Status: Abnormal   Collection Time: 07/07/17  8:47 AM  Result Value Ref Range   Sodium 142 135 - 145 mmol/L   Potassium 3.1 (L) 3.5 - 5.1 mmol/L   Chloride 106 101 - 111 mmol/L   CO2 26 22 - 32 mmol/L   Glucose, Bld 102 (H) 65 - 99 mg/dL   BUN 6 6 - 20 mg/dL   Creatinine, Ser 0.69 0.44 - 1.00 mg/dL   Calcium 9.6 8.9 - 10.3 mg/dL   Total Protein 8.2 (H) 6.5 - 8.1 g/dL   Albumin 4.8 3.5 - 5.0 g/dL   AST 26 15 - 41 U/L   ALT 19 14 - 54 U/L   Alkaline Phosphatase 68 38 - 126 U/L   Total Bilirubin 0.1 (L) 0.3 - 1.2 mg/dL   GFR calc non Af Amer >60 >60 mL/min   GFR calc Af Amer >60 >60 mL/min    Comment: (NOTE) The eGFR has been calculated using the CKD EPI equation. This calculation has not been validated in all clinical situations. eGFR's persistently <60 mL/min signify possible Chronic Kidney Disease.    Anion gap 10 5 - 15  Ethanol     Status: Abnormal   Collection Time: 07/07/17  8:47 AM  Result Value Ref Range   Alcohol, Ethyl (B) 139 (H) <10 mg/dL    Comment:        LOWEST DETECTABLE LIMIT FOR SERUM ALCOHOL IS 10 mg/dL FOR MEDICAL PURPOSES ONLY Please note change in reference range.   Salicylate level     Status: None   Collection Time: 07/07/17  8:47 AM  Result Value Ref Range   Salicylate Lvl <2.7 2.8 - 30.0 mg/dL  Acetaminophen level     Status: Abnormal     Collection  Time: 07/07/17  8:47 AM  Result Value Ref Range   Acetaminophen (Tylenol), Serum <10 (L) 10 - 30 ug/mL    Comment:        THERAPEUTIC CONCENTRATIONS VARY SIGNIFICANTLY. A RANGE OF 10-30 ug/mL MAY BE AN EFFECTIVE CONCENTRATION FOR MANY PATIENTS. HOWEVER, SOME ARE BEST TREATED AT CONCENTRATIONS OUTSIDE THIS RANGE. ACETAMINOPHEN CONCENTRATIONS >150 ug/mL AT 4 HOURS AFTER INGESTION AND >50 ug/mL AT 12 HOURS AFTER INGESTION ARE OFTEN ASSOCIATED WITH TOXIC REACTIONS.   cbc     Status: None   Collection Time: 07/07/17  8:47 AM  Result Value Ref Range   WBC 6.6 4.0 - 10.5 K/uL   RBC 4.41 3.87 - 5.11 MIL/uL   Hemoglobin 13.5 12.0 - 15.0 g/dL   HCT 39.0 36.0 - 46.0 %   MCV 88.4 78.0 - 100.0 fL   MCH 30.6 26.0 - 34.0 pg   MCHC 34.6 30.0 - 36.0 g/dL   RDW 12.3 11.5 - 15.5 %   Platelets 267 150 - 400 K/uL  I-Stat beta hCG blood, ED     Status: None   Collection Time: 07/07/17  8:58 AM  Result Value Ref Range   I-stat hCG, quantitative <5.0 <5 mIU/mL   Comment 3            Comment:   GEST. AGE      CONC.  (mIU/mL)   <=1 WEEK        5 - 50     2 WEEKS       50 - 500     3 WEEKS       100 - 10,000     4 WEEKS     1,000 - 30,000        FEMALE AND NON-PREGNANT FEMALE:     LESS THAN 5 mIU/mL   CBG monitoring, ED     Status: None   Collection Time: 07/07/17 10:59 AM  Result Value Ref Range   Glucose-Capillary 77 65 - 99 mg/dL  Basic metabolic panel     Status: None   Collection Time: 07/08/17  6:29 PM  Result Value Ref Range   Sodium 139 135 - 145 mmol/L   Potassium 3.9 3.5 - 5.1 mmol/L   Chloride 105 101 - 111 mmol/L   CO2 26 22 - 32 mmol/L   Glucose, Bld 93 65 - 99 mg/dL   BUN 15 6 - 20 mg/dL   Creatinine, Ser 0.78 0.44 - 1.00 mg/dL   Calcium 8.9 8.9 - 10.3 mg/dL   GFR calc non Af Amer >60 >60 mL/min   GFR calc Af Amer >60 >60 mL/min    Comment: (NOTE) The eGFR has been calculated using the CKD EPI equation. This calculation has not been validated in all  clinical situations. eGFR's persistently <60 mL/min signify possible Chronic Kidney Disease.    Anion gap 8 5 - 15    Comment: Performed at [facility], [place] [address] [place], [place] [address]    Assessment/Plan: 1. Substance induced mood disorder [facility]) Patient doing well since discharge. No recurrence of SI/HI. No recurrence of substance use. Encouraged her to seek AA. Follow-up scheduled with PCP.  2. Depression, unspecified depression type Tolerating medications well. Will change Wellbutrin to 150 mg XL. Follow-up scheduled.   Leeanne Rio, PA-C

## 2017-07-13 NOTE — Patient Instructions (Addendum)
I am glad you are doing well. I am switching the Wellbutrin to 150 mg XL to take once daily. I am resubmitting the Trazodone. Please start as directed.  I am extending her leave to 07/21/17 so that you can attend your follow-up with the [facility] on 07/20/17.  Please consider going to an AA meeting. Keep you support system around.   Follow-up with Dr. Beverely Low in 2 weeks.

## 2017-07-13 NOTE — Progress Notes (Signed)
Pre visit review using our clinic review tool, if applicable. No additional management support is needed unless otherwise documented below in the visit note. 

## 2017-07-15 ENCOUNTER — Encounter: Payer: Self-pay | Admitting: Physician Assistant

## 2017-07-20 ENCOUNTER — Encounter: Payer: Self-pay | Admitting: Physician Assistant

## 2017-07-20 DIAGNOSIS — F1410 Cocaine abuse, uncomplicated: Secondary | ICD-10-CM | POA: Diagnosis not present

## 2017-07-20 DIAGNOSIS — F1010 Alcohol abuse, uncomplicated: Secondary | ICD-10-CM | POA: Diagnosis not present

## 2017-07-20 DIAGNOSIS — F902 Attention-deficit hyperactivity disorder, combined type: Secondary | ICD-10-CM | POA: Diagnosis not present

## 2017-07-20 DIAGNOSIS — F411 Generalized anxiety disorder: Secondary | ICD-10-CM | POA: Diagnosis not present

## 2017-07-28 ENCOUNTER — Encounter: Payer: Self-pay | Admitting: Family Medicine

## 2017-07-28 ENCOUNTER — Ambulatory Visit (INDEPENDENT_AMBULATORY_CARE_PROVIDER_SITE_OTHER): Payer: 59 | Admitting: Family Medicine

## 2017-07-28 VITALS — BP 106/70 | HR 67 | Temp 98.0°F | Resp 16 | Ht 64.0 in | Wt 108.1 lb

## 2017-07-28 DIAGNOSIS — F419 Anxiety disorder, unspecified: Secondary | ICD-10-CM | POA: Diagnosis not present

## 2017-07-28 DIAGNOSIS — F332 Major depressive disorder, recurrent severe without psychotic features: Secondary | ICD-10-CM | POA: Diagnosis not present

## 2017-07-28 NOTE — Patient Instructions (Signed)
Schedule your complete physical in 6 months Continue the Sertraline and the Wellbutrin daily I am SO proud of you for getting the help you need!!! Keep moving forward with the positive influences in your life Call with any questions or concerns Happy Fall!!!

## 2017-07-28 NOTE — Assessment & Plan Note (Signed)
Chronic problem.  Much better control today.  Pt is not drinking- which worsens her anxiety.  Applauded her efforts.  Will follow.

## 2017-07-28 NOTE — Progress Notes (Signed)
   Subjective:    Patient ID: Tempie HoistAutumn M Heinicke, female    DOB: 03/02/1986, 31 y.o.   MRN: [id number]  HPI Anxiety/depression- chronic problem, currently taking Sertraline and Wellbutrin and feels things are improving.  No urges to start drinking.  Pt is not currently using any other substances.  Pt went to [facility] (she 'wasn't a big fan') and doesn't return until December to see a Psychiatrist.  Pt has not been to AA but doesn't feel that she currently needs to.  Pt is open to this in the future.  Currently she feels that being back at work, staying busy, and 'getting people out of my life that don't belong in my life' she is doing well.  No thoughts of self harm.   Review of Systems For ROS see HPI     Objective:   Physical Exam  Constitutional: She is oriented to person, place, and time. She appears well-developed and well-nourished. No distress.  HENT:  Head: Normocephalic and atraumatic.  Neurological: She is alert and oriented to person, place, and time.  Skin: Skin is warm and dry.  Psychiatric: She has a normal mood and affect. Her behavior is normal. Thought content normal.  Vitals reviewed.         Assessment & Plan:

## 2017-07-28 NOTE — Assessment & Plan Note (Signed)
Much improved since restarting Sertraline with the Wellbutrin.  No longer self medicating and doesn't feel the need to.  Has appt upcoming with [facility].  No med changes at this time.  Will follow.

## 2017-08-16 ENCOUNTER — Other Ambulatory Visit: Payer: Self-pay | Admitting: Physician Assistant

## 2017-08-19 ENCOUNTER — Encounter: Payer: Self-pay | Admitting: Family Medicine

## 2017-08-19 MED ORDER — BUPROPION HCL ER (XL) 150 MG PO TB24: 150.0000 mg | ORAL_TABLET | Freq: Every day | ORAL | 1 refills | Status: DC

## 2017-08-25 MED FILL — BUPROPION HCL XL 150 MG TAB: 150 | 90 days supply | Qty: 90 | Fill #0

## 2017-09-20 DIAGNOSIS — Z202 Contact with and (suspected) exposure to infections with a predominantly sexual mode of transmission: Secondary | ICD-10-CM | POA: Diagnosis not present

## 2017-09-20 DIAGNOSIS — N898 Other specified noninflammatory disorders of vagina: Secondary | ICD-10-CM | POA: Diagnosis not present

## 2017-12-17 ENCOUNTER — Encounter: Payer: Self-pay | Admitting: Family Medicine

## 2017-12-17 MED ORDER — FLUCONAZOLE 150 MG PO TABS: 150.0000 mg | ORAL_TABLET | Freq: Once | ORAL | 0 refills | Status: AC

## 2018-01-07 MED FILL — buPROPion HCL ER (XL) 150 M: 150 | 90 days supply | Qty: 90 | Fill #1

## 2018-04-06 ENCOUNTER — Telehealth: Payer: Self-pay | Admitting: Family

## 2018-04-06 DIAGNOSIS — N760 Acute vaginitis: Secondary | ICD-10-CM

## 2018-04-06 DIAGNOSIS — B9689 Other specified bacterial agents as the cause of diseases classified elsewhere: Secondary | ICD-10-CM

## 2018-04-06 MED ORDER — METRONIDAZOLE 500 MG PO TABS: 500.0000 mg | ORAL_TABLET | Freq: Two times a day (BID) | ORAL | 0 refills | Status: DC

## 2018-04-06 MED ORDER — CLINDAMYCIN PHOSPHATE 2 % VA CREA: 1.0000 | TOPICAL_CREAM | Freq: Every day | VAGINAL | 0 refills | Status: DC

## 2018-04-06 MED ORDER — METRONIDAZOLE 0.75 % VA GEL: 1.0000 | Freq: Two times a day (BID) | VAGINAL | 0 refills | Status: DC

## 2018-04-06 MED FILL — FLUoxetine HCL 20 MG CAPS: 20 | 30 days supply | Qty: 30 | Fill #0

## 2018-04-06 MED FILL — metroNIDAZOLE 500 MG TABS: 500 | 7 days supply | Qty: 14 | Fill #0

## 2018-04-06 NOTE — Addendum Note (Signed)
Addended by: Jannifer RodneyHAWKS, Andora Krull A on: 04/06/2018 02:50 PM   Modules accepted: Orders

## 2018-04-06 NOTE — Addendum Note (Signed)
Addended by: Jannifer RodneyHAWKS, Lavaris Sexson A on: 04/06/2018 04:33 PM   Modules accepted: Orders

## 2018-04-06 NOTE — Progress Notes (Signed)

## 2018-05-03 ENCOUNTER — Telehealth: Payer: Self-pay | Admitting: *Deleted

## 2018-05-03 NOTE — Telephone Encounter (Signed)
Ok to transfer. 

## 2018-05-03 NOTE — Telephone Encounter (Signed)
Copied from CRM [phone]. Topic: Appointment Scheduling - Scheduling Inquiry for Clinic >> May 03, 2018  1:03 PM Tamela OddiMartin, [place], NT wrote: Reason for CRM: Patient called and states that she would like to transfer care from Dr. Beverely Lowabori to [facility]. Due to location. Patient states she prefer afternoon appts. Please call patient when this Transfer is approved to schedule appt . CB# [phone]

## 2018-05-03 NOTE — Telephone Encounter (Signed)
[facility]

## 2018-05-05 MED FILL — busPIRone HCL 15 MG TABS: 15 | 30 days supply | Qty: 90 | Fill #0

## 2018-05-05 MED FILL — FLUoxetine HCL 20 MG CAPS: 20 | 30 days supply | Qty: 30 | Fill #1

## 2018-05-23 ENCOUNTER — Encounter: Payer: Self-pay | Admitting: Nurse Practitioner

## 2018-05-23 ENCOUNTER — Ambulatory Visit (INDEPENDENT_AMBULATORY_CARE_PROVIDER_SITE_OTHER): Payer: Managed Care, Other (non HMO) | Admitting: Nurse Practitioner

## 2018-05-23 VITALS — BP 120/86 | HR 63 | Temp 98.1°F | Resp 16 | Ht 64.0 in | Wt 114.0 lb

## 2018-05-23 DIAGNOSIS — J309 Allergic rhinitis, unspecified: Secondary | ICD-10-CM | POA: Diagnosis not present

## 2018-05-23 DIAGNOSIS — F419 Anxiety disorder, unspecified: Secondary | ICD-10-CM | POA: Diagnosis not present

## 2018-05-23 DIAGNOSIS — F329 Major depressive disorder, single episode, unspecified: Secondary | ICD-10-CM | POA: Diagnosis not present

## 2018-05-23 DIAGNOSIS — F32A Depression, unspecified: Secondary | ICD-10-CM

## 2018-05-23 MED ORDER — FLUOXETINE HCL 20 MG PO CAPS: 20.0000 mg | ORAL_CAPSULE | Freq: Every day | ORAL | 1 refills | Status: DC

## 2018-05-23 MED ORDER — FLUTICASONE PROPIONATE 50 MCG/ACT NA SUSP: 2.0000 | Freq: Every day | NASAL | 2 refills | Status: DC

## 2018-05-23 MED ORDER — BUSPIRONE HCL 15 MG PO TABS
15.0000 mg | ORAL_TABLET | Freq: Every day | ORAL | 1 refills | Status: DC
Start: 2018-05-23 — End: 2018-10-27

## 2018-05-23 NOTE — Assessment & Plan Note (Signed)
Discussed home management, return precautions and provided additional information on AVS F/U for new, worsening symptoms or no relief with flonase  - fluticasone (FLONASE) 50 MCG/ACT nasal spray; Place 2 sprays into both nostrils daily.  Dispense: 16 g; Refill: 2

## 2018-05-23 NOTE — Assessment & Plan Note (Signed)
Stable Continue current medications F/U for new, worsening symptoms - busPIRone (BUSPAR) 15 MG tablet; Take 1 tablet (15 mg total) by mouth daily.  Dispense: 90 tablet; Refill: 1 - FLUoxetine (PROZAC) 20 MG capsule; Take 1 capsule (20 mg total) by mouth daily.  Dispense: 90 capsule; Refill: 1

## 2018-05-23 NOTE — Progress Notes (Signed)
Name: Allison Ewing   MRN: [id number]    DOB: [birth year]   Date:05/23/2018       Progress Note  Subjective  Chief Complaint  Chief Complaint  Patient presents with  . Establish Care    wants you to prescribe anxiety medications    HPI [name] is here today to establish care as a transfer from another provider in a different [facility] practice, moving due to location. Aside from primary care, she is routinely followed by Psychiatry for management of mood disorder. She is requesting to have prozac and buspar refilled by me today due to cost associated with follow up at psychiatry office. She reports a long history of anxiety and depression, substance abuse (cocaine and alcohol) and was hospitalized for psychiatric care last September. She reports she has been free of cocaine since, and does continue to drink alcohol but only about 5 drinks on the weekends. She denies any other substance use. She established with psychiatry earlier this year and her daily medications were switched from sertraline and wellbutrin to prozac and buspar, which she feels have greatly improved her mood. She reports taking the medications daily as prescribed and has not noted any adverse medication effects. She reports sleeping well, eating well, and overall feels well. Denies weakness, insomnia, syncope, mood swings, thoughts of hurting herself or others. She is also requesting a refill of flonase for seasonal allergies. Reports a history of seasonal allergies, has noted symptoms recently and has tried claritin and Careers adviserallegra but feels flonase works better.   GAD 7 : Generalized Anxiety Score 05/23/2018  Nervous, Anxious, on Edge 1  Control/stop worrying 1  Worry too much - different things 1  Trouble relaxing 1  Restless 1  Easily annoyed or irritable 1  Afraid - awful might happen 1  Total GAD 7 Score 7   [facility] 05/23/2018 07/28/2017 07/13/2017  Decreased Interest 0 0 0  Down, Depressed, Hopeless 0 0 0   PHQ - 2 Score 0 0 0  Altered sleeping 0 0 0  Tired, decreased energy 1 0 0  Change in appetite 0 0 0  Feeling bad or failure about yourself  0 0 0  Trouble concentrating 1 0 0  Moving slowly or fidgety/restless 0 0 0  Suicidal thoughts 0 0 0  PHQ-9 Score 2 0 0  Difficult doing work/chores - Not difficult at all Not difficult at all      Patient Active Problem List   Diagnosis Date Noted  . Substance induced mood disorder (HCC) 07/08/2017  . Alcohol abuse 07/07/2017  . Cocaine abuse (HCC) 07/07/2017  . Major depressive disorder, recurrent severe without psychotic features (HCC) 07/07/2017  . Anxiety 04/07/2017  . S/P cesarean section 08/06/2014    Past Surgical History:  Procedure Laterality Date  . CESAREAN SECTION N/A 08/06/2014   Procedure: CESAREAN SECTION;  Surgeon: Mitchel HonourMegan Morris, DO;  Location: [facility];  Service: Obstetrics;  Laterality: N/A;  . DILATION AND CURETTAGE OF UTERUS  2007  . LASER ABLATION OF THE CERVIX    . LEEP      Family History  Problem Relation Age of Onset  . Diabetes Maternal Aunt   . Aneurysm Maternal Grandmother   . Cancer Maternal Grandfather        lung  . Hypertension Paternal Grandmother   . Cancer Paternal Grandmother        cervical  . Stroke Paternal Grandmother   . Hyperlipidemia Mother   . Hypertension  Mother   . Lung disease Father     Social History   Socioeconomic History  . Marital status: Single    Spouse name: Not on file  . Number of children: Not on file  . Years of education: Not on file  . Highest education level: Not on file  Occupational History  . Not on file  Social Needs  . Financial resource strain: Not on file  . Food insecurity:    Worry: Not on file    Inability: Not on file  . Transportation needs:    Medical: Not on file    Non-medical: Not on file  Tobacco Use  . Smoking status: Former Smoker    Packs/day: 1.00    Types: Cigarettes    Last attempt to quit: 04/07/2016    Years since  quitting: 2.1  . Smokeless tobacco: Never Used  Substance and Sexual Activity  . Alcohol use: Yes    Alcohol/week: 6.0 standard drinks    Types: 6 Cans of beer per week    Comment: Weekly use -- BAC was 139 on admission  . Drug use: Yes    Frequency: 4.0 times per week    Types: Cocaine    Comment: 4x cocaine per week  . Sexual activity: Yes  Lifestyle  . Physical activity:    Days per week: Not on file    Minutes per session: Not on file  . Stress: Not on file  Relationships  . Social connections:    Talks on phone: Not on file    Gets together: Not on file    Attends religious service: Not on file    Active member of club or organization: Not on file    Attends meetings of clubs or organizations: Not on file    Relationship status: Not on file  . Intimate partner violence:    Fear of current or ex partner: Not on file    Emotionally abused: Not on file    Physically abused: Not on file    Forced sexual activity: Not on file  Other Topics Concern  . Not on file  Social History Narrative  . Not on file     Current Outpatient Medications:  .  busPIRone (BUSPAR) 15 MG tablet, Take 15 mg by mouth daily., Disp: , Rfl:  .  FLUoxetine (PROZAC) 20 MG capsule, Take 20 mg by mouth daily., Disp: , Rfl:  .  fluticasone (FLONASE) 50 MCG/ACT nasal spray, Place 2 sprays into both nostrils daily., Disp: , Rfl:   No Known Allergies   ROS See HPI  Objective  Vitals:   05/23/18 1553  BP: 120/86  Pulse: 63  Resp: 16  Temp: 98.1 F (36.7 C)  TempSrc: Oral  SpO2: 99%  Weight: 114 lb (51.7 kg)  Height: 5\' 4"  (1.626 m)    Body mass index is 19.57 kg/m.  Physical Exam Vital signs reviewed. Constitutional: Patient appears well-developed and well-nourished. No distress.  HENT: Head: Normocephalic and atraumatic. Nose: Nose normal. Mouth/Throat: Oropharynx is clear and moist. No oropharyngeal exudate.  Eyes: Conjunctivae and EOM are normal. Pupils are equal, round, and  reactive to light. No scleral icterus.  Neck: Normal range of motion. Neck supple. Cardiovascular: Normal rate, regular rhythm and normal heart sounds.  No murmur heard. No BLE edema. Distal pulses intact. Pulmonary/Chest: Effort normal and breath sounds normal. No respiratory distress. Neurological: She is alert and oriented to person, place, and time. No cranial nerve deficit. Coordination, balance, strength,  speech and gait are normal.  Skin: Skin is warm and dry. No rash noted. No erythema.  Psychiatric: Patient has a normal mood and affect. behavior is normal. Judgment and thought content normal.   Assessment & Plan RTC in 3 months for CPE with annual lab work  -Reviewed Health Maintenance: up to date

## 2018-05-23 NOTE — Patient Instructions (Signed)
I have sent refills for you.  Please return in about 3 months for annual physical with fasting lab work, or sooner if needed.  It was nice to meet you today!  Allergic Rhinitis, Adult Allergic rhinitis is an allergic reaction that affects the mucous membrane inside the nose. It causes sneezing, a runny or stuffy nose, and the feeling of mucus going down the back of the throat (postnasal drip). Allergic rhinitis can be mild to severe. There are two types of allergic rhinitis:  Seasonal. This type is also called hay fever. It happens only during certain seasons.  Perennial. This type can happen at any time of the year.  What are the causes? This condition happens when the body's defense system (immune system) responds to certain harmless substances called allergens as though they were germs.  Seasonal allergic rhinitis is triggered by pollen, which can come from grasses, trees, and weeds. Perennial allergic rhinitis may be caused by:  House dust mites.  Pet dander.  Mold spores.  What are the signs or symptoms? Symptoms of this condition include:  Sneezing.  Runny or stuffy nose (nasal congestion).  Postnasal drip.  Itchy nose.  Tearing of the eyes.  Trouble sleeping.  Daytime sleepiness.  How is this diagnosed? This condition may be diagnosed based on:  Your medical history.  A physical exam.  Tests to check for related conditions, such as: ? Asthma. ? Pink eye. ? Ear infection. ? Upper respiratory infection.  Tests to find out which allergens trigger your symptoms. These may include skin or blood tests.  How is this treated? There is no cure for this condition, but treatment can help control symptoms. Treatment may include:  Taking medicines that block allergy symptoms, such as antihistamines. Medicine may be given as a shot, nasal spray, or pill.  Avoiding the allergen.  Desensitization. This treatment involves getting ongoing shots until your body  becomes less sensitive to the allergen. This treatment may be done if other treatments do not help.  If taking medicine and avoiding the allergen does not work, new, stronger medicines may be prescribed.  Follow these instructions at home:  Find out what you are allergic to. Common allergens include smoke, dust, and pollen.  Avoid the things you are allergic to. These are some things you can do to help avoid allergens: ? Replace carpet with wood, tile, or vinyl flooring. Carpet can trap dander and dust. ? Do not smoke. Do not allow smoking in your home. ? Change your heating and air conditioning filter at least once a month. ? During allergy season:  Keep windows closed as much as possible.  Plan outdoor activities when pollen counts are lowest. This is usually during the evening hours.  When coming indoors, change clothing and shower before sitting on furniture or bedding.  Take over-the-counter and prescription medicines only as told by your health care provider.  Keep all follow-up visits as told by your health care provider. This is important. Contact a health care provider if:  You have a fever.  You develop a persistent cough.  You make whistling sounds when you breathe (you wheeze).  Your symptoms interfere with your normal daily activities. Get help right away if:  You have shortness of breath. Summary  This condition can be managed by taking medicines as directed and avoiding allergens.  Contact your health care provider if you develop a persistent cough or fever.  During allergy season, keep windows closed as much as possible. This information  is not intended to replace advice given to you by your health care provider. Make sure you discuss any questions you have with your health care provider. Document Released: 06/23/2001 Document Revised: 11/05/2016 Document Reviewed: 11/05/2016 Elsevier Interactive Patient Education  [employer].

## 2018-06-03 ENCOUNTER — Telehealth: Payer: Self-pay | Admitting: Nurse Practitioner

## 2018-06-03 NOTE — Telephone Encounter (Signed)
Fax [facility] to [facility] for records

## 2018-06-15 MED FILL — FLUoxetine HCL 20 MG CAPS: 20 | 90 days supply | Qty: 90 | Fill #0

## 2018-07-05 MED FILL — busPIRone HCL 15 MG TABS: 15 | 30 days supply | Qty: 90 | Fill #1

## 2018-07-26 MED FILL — busPIRone HCL 15 MG TABS: 15 | 30 days supply | Qty: 90 | Fill #1

## 2018-09-19 MED FILL — FLUoxetine HCL 20 MG CAPS: 20 | 90 days supply | Qty: 90 | Fill #1

## 2018-10-27 ENCOUNTER — Other Ambulatory Visit: Payer: Self-pay | Admitting: *Deleted

## 2018-10-27 DIAGNOSIS — F419 Anxiety disorder, unspecified: Principal | ICD-10-CM

## 2018-10-27 DIAGNOSIS — F32A Depression, unspecified: Secondary | ICD-10-CM

## 2018-10-27 DIAGNOSIS — F329 Major depressive disorder, single episode, unspecified: Secondary | ICD-10-CM

## 2018-10-27 MED ORDER — BUSPIRONE HCL 15 MG PO TABS: 15.0000 mg | ORAL_TABLET | Freq: Every day | ORAL | 0 refills | Status: DC

## 2018-11-11 ENCOUNTER — Ambulatory Visit: Payer: Self-pay | Admitting: Nurse Practitioner

## 2018-11-11 DIAGNOSIS — Z0289 Encounter for other administrative examinations: Secondary | ICD-10-CM

## 2018-11-14 ENCOUNTER — Ambulatory Visit: Payer: Self-pay | Admitting: Nurse Practitioner

## 2018-11-17 ENCOUNTER — Other Ambulatory Visit (INDEPENDENT_AMBULATORY_CARE_PROVIDER_SITE_OTHER): Payer: Self-pay

## 2018-11-17 ENCOUNTER — Encounter: Payer: Self-pay | Admitting: Nurse Practitioner

## 2018-11-17 ENCOUNTER — Ambulatory Visit (INDEPENDENT_AMBULATORY_CARE_PROVIDER_SITE_OTHER): Payer: Self-pay | Admitting: Nurse Practitioner

## 2018-11-17 VITALS — BP 110/80 | HR 78 | Ht 64.0 in | Wt 115.0 lb

## 2018-11-17 DIAGNOSIS — F419 Anxiety disorder, unspecified: Secondary | ICD-10-CM

## 2018-11-17 DIAGNOSIS — F329 Major depressive disorder, single episode, unspecified: Secondary | ICD-10-CM

## 2018-11-17 DIAGNOSIS — F332 Major depressive disorder, recurrent severe without psychotic features: Secondary | ICD-10-CM

## 2018-11-17 DIAGNOSIS — F32A Depression, unspecified: Secondary | ICD-10-CM

## 2018-11-17 LAB — CBC
HCT: 39.8 % (ref 36.0–46.0)
Hemoglobin: 13.5 g/dL (ref 12.0–15.0)
MCHC: 33.9 g/dL (ref 30.0–36.0)
MCV: 91.8 fl (ref 78.0–100.0)
Platelets: 249 10*3/uL (ref 150.0–400.0)
RBC: 4.33 Mil/uL (ref 3.87–5.11)
RDW: 12.3 % (ref 11.5–15.5)
WBC: 4.3 10*3/uL (ref 4.0–10.5)

## 2018-11-17 LAB — COMPREHENSIVE METABOLIC PANEL
ALBUMIN: 4.3 g/dL (ref 3.5–5.2)
ALT: 17 U/L (ref 0–35)
AST: 17 U/L (ref 0–37)
Alkaline Phosphatase: 52 U/L (ref 39–117)
BUN: 14 mg/dL (ref 6–23)
CALCIUM: 9.1 mg/dL (ref 8.4–10.5)
CO2: 30 mEq/L (ref 19–32)
CREATININE: 0.76 mg/dL (ref 0.40–1.20)
Chloride: 103 mEq/L (ref 96–112)
GFR: 87.89 mL/min (ref >60.00)
Glucose, Bld: 74 mg/dL (ref 70–99)
Potassium: 3.7 mEq/L (ref 3.5–5.1)
Sodium: 139 mEq/L (ref 135–145)
TOTAL PROTEIN: 7 g/dL (ref 6.0–8.3)
Total Bilirubin: 0.4 mg/dL (ref 0.2–1.2)

## 2018-11-17 MED ORDER — VENLAFAXINE HCL ER 37.5 MG PO CP24: 37.5000 mg | ORAL_CAPSULE | Freq: Every day | ORAL | 1 refills | Status: DC

## 2018-11-17 MED FILL — VENLAFAXINE HCL ER 37.5 MG: 37.5 | 60 days supply | Qty: 60 | Fill #0

## 2018-11-17 NOTE — Patient Instructions (Signed)
Head downstairs for labs  Stop buspar, prozac  Start effexor 37.5mg  once  for 1 week and then increase to 75mg  once daily on week two as tolerated.  Some side effects such as nausea, drowsiness and weight gain can occur.  Also rarely people have experienced suicidal thoughts when taking this medication.  Please discontinue the medication and go directly to ED if this occurs.  Id like to see you back in about 1 month to evaluate progress.

## 2018-11-17 NOTE — Progress Notes (Signed)
Allison Ewing is a 33 y.o. female with the following history as recorded in EpicCare:  Patient Active Problem List   Diagnosis Date Noted  . Allergic rhinitis 05/23/2018  . Substance induced mood disorder ([place]) 07/08/2017  . Alcohol abuse 07/07/2017  . Cocaine abuse ([place]) 07/07/2017  . Major depressive disorder, recurrent severe without psychotic features ([place]) 07/07/2017  . Anxiety and depression 04/07/2017  . S/P cesarean section 08/06/2014    Current Outpatient Medications  Medication Sig Dispense Refill  . fluticasone (FLONASE) 50 MCG/ACT nasal spray Place 2 sprays into both nostrils daily. 16 g 2  . venlafaxine XR (EFFEXOR XR) 37.5 MG 24 hr capsule Take 1 capsule (37.5 mg total) by mouth daily with breakfast. 60 capsule 1   No current facility-administered medications for this visit.     Allergies: Patient has no known allergies.  Past Medical History:  Diagnosis Date  . Anxiety   . Anxiety   . Depression   . Vaginal Pap smear, abnormal     Past Surgical History:  Procedure Laterality Date  . CESAREAN SECTION N/A 08/06/2014   Procedure: CESAREAN SECTION;  Surgeon: Linda Hedges, DO;  Location: [facility];  Service: Obstetrics;  Laterality: N/A;  . DILATION AND CURETTAGE OF UTERUS  2007  . LASER ABLATION OF THE CERVIX    . LEEP      Family History  Problem Relation Age of Onset  . Diabetes Maternal Aunt   . Aneurysm Maternal Grandmother   . Cancer Maternal Grandfather        lung  . Hypertension Paternal Grandmother   . Cancer Paternal Grandmother        cervical  . Stroke Paternal Grandmother   . Hyperlipidemia Mother   . Hypertension Mother   . Lung disease Father     Social History   Tobacco Use  . Smoking status: Former Smoker    Packs/day: 1.00    Types: Cigarettes    Last attempt to quit: 04/07/2016    Years since quitting: 2.6  . Smokeless tobacco: Never Used  Substance Use Topics  . Alcohol use: Yes    Alcohol/week: 6.0 standard drinks    Types: 6  Cans of beer per week    Comment: Weekly use -- BAC was 139 on admission     Subjective:  Allison Ewing is here today for f/u of anxiety, depression, requesting medication refill. We first met in 05/2018, she was instructed to return in about 3 months for CPE but she tells me today she would prefer not to have CPE, just wants to have medications refilled. She has been maintained on buspar 15, prozac 20 daily for anxiety and depression, however she did run out of her buspar about 1 mo ago, didn't realize a refill had already been sent, and also tells me she has noticed the prozac just does not seem to be working as well anymore, especially over past few months, she just has not had any motivation, often says she doesn't even feel like going out of her house, currently not working and has not felt motivated to find a job. She denies fevers, chills, weakness, insomnia, syncope, thoughts of hurting herself or others, cp, sob. She has heard/read about effexor and would like to know if this medication could help her.  ROS- See HPI  Objective:  Vitals:   11/17/18 1444  BP: 110/80  Pulse: 78  SpO2: 99%  Weight: 115 lb (52.2 kg)  Height: 5' 4" (1.626 m)  General: Well developed, well nourished, in no acute distress  Skin : Warm and dry.  Head: Normocephalic and atraumatic  Eyes: Sclera and conjunctiva clear; pupils round and reactive to light; extraocular movements intact  Oropharynx: Pink, supple. No suspicious lesions  Neck: Supple Lungs: Respirations unlabored; clear to auscultation bilaterally without wheeze, rales, rhonchi  CVS exam: normal rate, regular rhythm, normal S1, S2, no murmurs, rubs, clicks or gallops.  Extremities: No edema, cyanosis, clubbing  Vessels: Symmetric bilaterally  Neurologic: Alert and oriented; speech intact; face symmetrical; moves all extremities well; CNII-XII intact without focal deficit  Psychiatric: Normal mood and affect.  Assessment:  1. Anxiety and  depression     Plan:  Declines CPE, discussed that at minimum we should check annual CBC, CMET and she is agreeable Return in about 1 month (around 12/16/2018) for F/U- anxiety, depression- medication change.

## 2018-11-17 NOTE — Assessment & Plan Note (Addendum)
We discussed treatment options for anxiety and depression- resume buspar, increase prozac... she would really like to try effexor- she has been off buspar for a month, will not resume at this time, will have her stop prozac and start effexor-medication dosing, side effects discussed Red flags and return precautions including when to seek immediate/emergency care discussed and printed on AVS RTC 1 month to check response to medication changes - CBC; Future - Comprehensive metabolic panel; Future - venlafaxine XR (EFFEXOR XR) 37.5 MG 24 hr capsule; Take 1 capsule (37.5 mg total) by mouth daily with breakfast.  Dispense: 60 capsule; Refill: 1

## 2018-12-14 ENCOUNTER — Other Ambulatory Visit (INDEPENDENT_AMBULATORY_CARE_PROVIDER_SITE_OTHER): Payer: Self-pay

## 2018-12-14 ENCOUNTER — Ambulatory Visit (INDEPENDENT_AMBULATORY_CARE_PROVIDER_SITE_OTHER): Payer: Self-pay | Admitting: Nurse Practitioner

## 2018-12-14 ENCOUNTER — Encounter: Payer: Self-pay | Admitting: Nurse Practitioner

## 2018-12-14 VITALS — BP 140/90 | HR 80 | Ht 64.0 in | Wt 113.0 lb

## 2018-12-14 DIAGNOSIS — R61 Generalized hyperhidrosis: Secondary | ICD-10-CM

## 2018-12-14 DIAGNOSIS — F32A Depression, unspecified: Secondary | ICD-10-CM

## 2018-12-14 DIAGNOSIS — F329 Major depressive disorder, single episode, unspecified: Secondary | ICD-10-CM

## 2018-12-14 DIAGNOSIS — R030 Elevated blood-pressure reading, without diagnosis of hypertension: Secondary | ICD-10-CM

## 2018-12-14 DIAGNOSIS — F419 Anxiety disorder, unspecified: Secondary | ICD-10-CM

## 2018-12-14 LAB — TSH: TSH: 0.45 u[IU]/mL (ref 0.35–4.50)

## 2018-12-14 MED ORDER — VENLAFAXINE HCL ER 75 MG PO CP24: 150.0000 mg | ORAL_CAPSULE | Freq: Every day | ORAL | 1 refills | Status: DC

## 2018-12-14 MED FILL — VENLAFAXINE HCL ER 75 MG CA: 75 | 30 days supply | Qty: 60 | Fill #0

## 2018-12-14 NOTE — Patient Instructions (Signed)
Increase effexor to 150 mg once daily as discussed, decrease back to 75 mg daily if this is too much  Please try to check your blood pressure once daily or at least a few times a week, at the same time each day, and keep a log.  Please return in 1 month for follow up, bring your blood pressure log   DASH Eating Plan DASH stands for "Dietary Approaches to Stop Hypertension." The DASH eating plan is a healthy eating plan that has been shown to reduce high blood pressure (hypertension). It may also reduce your risk for type 2 diabetes, heart disease, and stroke. The DASH eating plan may also help with weight loss. What are tips for following this plan?  General guidelines  Avoid eating more than 2,300 mg (milligrams) of salt (sodium) a day. If you have hypertension, you may need to reduce your sodium intake to 1,500 mg a day.  Limit alcohol intake to no more than 1 drink a day for nonpregnant women and 2 drinks a day for men. One drink equals 12 oz of beer, 5 oz of wine, or 1 oz of hard liquor.  Work with your health care provider to maintain a healthy body weight or to lose weight. Ask what an ideal weight is for you.  Get at least 30 minutes of exercise that causes your heart to beat faster (aerobic exercise) most days of the week. Activities may include walking, swimming, or biking.  Work with your health care provider or diet and nutrition specialist (dietitian) to adjust your eating plan to your individual calorie needs. Reading food labels   Check food labels for the amount of sodium per serving. Choose foods with less than 5 percent of the Daily Value of sodium. Generally, foods with less than 300 mg of sodium per serving fit into this eating plan.  To find whole grains, look for the word "whole" as the first word in the ingredient list. Shopping  Buy products labeled as "low-sodium" or "no salt added."  Buy fresh foods. Avoid canned foods and premade or frozen  meals. Cooking  Avoid adding salt when cooking. Use salt-free seasonings or herbs instead of table salt or sea salt. Check with your health care provider or pharmacist before using salt substitutes.  Do not fry foods. Cook foods using healthy methods such as baking, boiling, grilling, and broiling instead.  Cook with heart-healthy oils, such as olive, canola, soybean, or sunflower oil. Meal planning  Eat a balanced diet that includes: ? 5 or more servings of fruits and vegetables each day. At each meal, try to fill half of your plate with fruits and vegetables. ? Up to 6-8 servings of whole grains each day. ? Less than 6 oz of lean meat, poultry, or fish each day. A 3-oz serving of meat is about the same size as a deck of cards. One egg equals 1 oz. ? 2 servings of low-fat dairy each day. ? A serving of nuts, seeds, or beans 5 times each week. ? Heart-healthy fats. Healthy fats called Omega-3 fatty acids are found in foods such as flaxseeds and coldwater fish, like sardines, salmon, and mackerel.  Limit how much you eat of the following: ? Canned or prepackaged foods. ? Food that is high in trans fat, such as fried foods. ? Food that is high in saturated fat, such as fatty meat. ? Sweets, desserts, sugary drinks, and other foods with added sugar. ? Full-fat dairy products.  Do not salt  foods before eating.  Try to eat at least 2 vegetarian meals each week.  Eat more home-cooked food and less restaurant, buffet, and fast food.  When eating at a restaurant, ask that your food be prepared with less salt or no salt, if possible. What foods are recommended? The items listed may not be a complete list. Talk with your dietitian about what dietary choices are best for you. Grains Whole-grain or whole-wheat bread. Whole-grain or whole-wheat pasta. Brown rice. [place]. Bulgur. Whole-grain and low-sodium cereals. Pita bread. Low-fat, low-sodium crackers. Whole-wheat flour  tortillas. Vegetables Fresh or frozen vegetables (raw, steamed, roasted, or grilled). Low-sodium or reduced-sodium tomato and vegetable juice. Low-sodium or reduced-sodium tomato sauce and tomato paste. Low-sodium or reduced-sodium canned vegetables. Fruits All fresh, dried, or frozen fruit. Canned fruit in natural juice (without added sugar). Meat and other protein foods Skinless chicken or [place]. Ground chicken or [place]. Pork with fat trimmed off. Fish and seafood. Egg whites. Dried beans, peas, or lentils. Unsalted nuts, nut butters, and seeds. Unsalted canned beans. Lean cuts of beef with fat trimmed off. Low-sodium, lean deli meat. Dairy Low-fat (1%) or fat-free (skim) milk. Fat-free, low-fat, or reduced-fat cheeses. Nonfat, low-sodium ricotta or cottage cheese. Low-fat or nonfat yogurt. Low-fat, low-sodium cheese. Fats and oils Soft margarine without trans fats. Vegetable oil. Low-fat, reduced-fat, or light mayonnaise and salad dressings (reduced-sodium). Canola, safflower, olive, soybean, and sunflower oils. Avocado. Seasoning and other foods Herbs. Spices. Seasoning mixes without salt. Unsalted popcorn and pretzels. Fat-free sweets. What foods are not recommended? The items listed may not be a complete list. Talk with your dietitian about what dietary choices are best for you. Grains Baked goods made with fat, such as croissants, muffins, or some breads. Dry pasta or rice meal packs. Vegetables Creamed or fried vegetables. Vegetables in a cheese sauce. Regular canned vegetables (not low-sodium or reduced-sodium). Regular canned tomato sauce and paste (not low-sodium or reduced-sodium). Regular tomato and vegetable juice (not low-sodium or reduced-sodium). Angie Fava. Olives. Fruits Canned fruit in a light or heavy syrup. Fried fruit. Fruit in cream or butter sauce. Meat and other protein foods Fatty cuts of meat. Ribs. Fried meat. Berniece Salines. Sausage. Bologna and other processed lunch meats.  Salami. Fatback. Hotdogs. Bratwurst. Salted nuts and seeds. Canned beans with added salt. Canned or smoked fish. Whole eggs or egg yolks. Chicken or [place] with skin. Dairy Whole or 2% milk, cream, and half-and-half. Whole or full-fat cream cheese. Whole-fat or sweetened yogurt. Full-fat cheese. Nondairy creamers. Whipped toppings. Processed cheese and cheese spreads. Fats and oils Butter. Stick margarine. Lard. Shortening. Ghee. Bacon fat. Tropical oils, such as coconut, palm kernel, or palm oil. Seasoning and other foods Salted popcorn and pretzels. Onion salt, garlic salt, seasoned salt, table salt, and sea salt. Worcestershire sauce. Tartar sauce. Barbecue sauce. Teriyaki sauce. Soy sauce, including reduced-sodium. Steak sauce. Canned and packaged gravies. Fish sauce. Oyster sauce. Cocktail sauce. Horseradish that you find on the shelf. Ketchup. Mustard. Meat flavorings and tenderizers. Bouillon cubes. Hot sauce and Tabasco sauce. Premade or packaged marinades. Premade or packaged taco seasonings. Relishes. Regular salad dressings. Where to find more information:  National Heart, Lung, and [place]: https://wilson-eaton.com/  American Heart Association: www.heart.org Summary  The DASH eating plan is a healthy eating plan that has been shown to reduce high blood pressure (hypertension). It may also reduce your risk for type 2 diabetes, heart disease, and stroke.  With the DASH eating plan, you should limit salt (sodium) intake to 2,300  mg a day. If you have hypertension, you may need to reduce your sodium intake to 1,500 mg a day.  When on the DASH eating plan, aim to eat more fresh fruits and vegetables, whole grains, lean proteins, low-fat dairy, and heart-healthy fats.  Work with your health care provider or diet and nutrition specialist (dietitian) to adjust your eating plan to your individual calorie needs. This information is not intended to replace advice given to you by your health  care provider. Make sure you discuss any questions you have with your health care provider. Document Released: 09/17/2011 Document Revised: 09/21/2016 Document Reviewed: 09/21/2016 Elsevier Interactive Patient Education  2019 [employer].

## 2018-12-14 NOTE — Assessment & Plan Note (Signed)
Increase effexor to 150 daily, also discussed with her that she can decrease back to 75 daily if she finds the 150 is too strong Not sure if her sweats are related to effexor, could consider this if sweats continue at follow up visit No recent TSH, will update RTC in 1 month for follow up - venlafaxine XR (EFFEXOR-XR) 75 MG 24 hr capsule; Take 2 capsules (150 mg total) by mouth daily with breakfast.  Dispense: 60 capsule; Refill: 1 - TSH; Future

## 2018-12-14 NOTE — Progress Notes (Signed)
Allison Ewing is a 33 y.o. female with the following history as recorded in EpicCare:  Patient Active Problem List   Diagnosis Date Noted  . Allergic rhinitis 05/23/2018  . Substance induced mood disorder (HCC) 07/08/2017  . Alcohol abuse 07/07/2017  . Cocaine abuse (HCC) 07/07/2017  . Major depressive disorder, recurrent severe without psychotic features (HCC) 07/07/2017  . Anxiety and depression 04/07/2017  . S/P cesarean section 08/06/2014    Current Outpatient Medications  Medication Sig Dispense Refill  . fluticasone (FLONASE) 50 MCG/ACT nasal spray Place 2 sprays into both nostrils daily. 16 g 2  . venlafaxine XR (EFFEXOR-XR) 75 MG 24 hr capsule Take 2 capsules (150 mg total) by mouth daily with breakfast. 60 capsule 1   No current facility-administered medications for this visit.     Allergies: Patient has no known allergies.  Past Medical History:  Diagnosis Date  . Anxiety   . Anxiety   . Depression   . Vaginal Pap smear, abnormal     Past Surgical History:  Procedure Laterality Date  . CESAREAN SECTION N/A 08/06/2014   Procedure: CESAREAN SECTION;  Surgeon: Mitchel Honour, DO;  Location: [facility];  Service: Obstetrics;  Laterality: N/A;  . DILATION AND CURETTAGE OF UTERUS  2007  . LASER ABLATION OF THE CERVIX    . LEEP      Family History  Problem Relation Age of Onset  . Diabetes Maternal Aunt   . Aneurysm Maternal Grandmother   . Cancer Maternal Grandfather        lung  . Hypertension Paternal Grandmother   . Cancer Paternal Grandmother        cervical  . Stroke Paternal Grandmother   . Hyperlipidemia Mother   . Hypertension Mother   . Lung disease Father     Social History   Tobacco Use  . Smoking status: Former Smoker    Packs/day: 1.00    Types: Cigarettes    Last attempt to quit: 04/07/2016    Years since quitting: 2.6  . Smokeless tobacco: Never Used  Substance Use Topics  . Alcohol use: Yes    Alcohol/week: 6.0 standard drinks    Types: 6  Cans of beer per week    Comment: Weekly use -- BAC was 139 on admission     Subjective:  [name] is here today for anxiety/depression follow up. At her last OV on 11/17/18, her prozac was switched to effexor due to uncontrolled anxiety/depression and also at her request, she heard good things about effexor and wanted to try it She has switched MEDS as instructed, now taking effexor 75 once daily, and reports good improvement in her symptoms with the effexor, however does not feel like the medication is strong enough and would like to increase the dosage, she has felt well on the medicine overall and has not noted adverse effects, she does mention waking up at night sweating for past week, this is new for her. Her BP is also slightly elevated, which she says she has also noticed some higher readings at home when she checks occasionally, in the 130s/90s, but has not been on BP medication before. She overall feels well today. She denies fevers, chills, weakness, syncope, confusion, cough, cp, sob, thoughts of hurting self/others.  BP Readings from Last 3 Encounters:  12/14/18 140/90  11/17/18 110/80  05/23/18 120/86    ROS- see HPI  Objective:  Vitals:   12/14/18 1422 12/14/18 1505  BP: (!) 136/96 140/90  Pulse:  80   SpO2: 99%   Weight: 113 lb (51.3 kg)   Height: 5\' 4"  (1.626 m)     General: Well developed, well nourished, in no acute distress  Skin : Warm and dry.  Head: Normocephalic and atraumatic  Eyes: Sclera and conjunctiva clear; pupils round and reactive to light; extraocular movements intact  Oropharynx: Pink, supple. No suspicious lesions  Neck: Supple without thyromegaly  Lungs: Respirations unlabored; clear to auscultation bilaterally without wheeze, rales, rhonchi  CVS exam: normal rate, regular rhythm, normal S1, S2, no murmurs, rubs, clicks or gallops.  Extremities: No edema, cyanosis, clubbing  Vessels: Symmetric bilaterally  Neurologic: Alert and oriented; speech  intact; face symmetrical; moves all extremities well; CNII-XII intact without focal deficit  Psychiatric: Normal mood and affect.  Assessment:  1. Night sweats   2. Anxiety and depression   3. Elevated blood pressure reading     Plan:   Night sweats X 1 week 11/17/18 CBC, CMET WNL Not sure if her sweats are related to effexor, could consider this if sweats continue at follow up visit No recent TSH, will update RTC in 1 month for follow up, or sooner for new, worsening symptoms including cough, weight loss, cp, sob - TSH; Future  Elevated blood pressure reading Noted on 2 readings today She will begin to log BP readings at home Home management, DASH DIET, red flags and return precautions including when to seek immediate care discussed and printed on AVS RTC in 1 month with BP log, can consider starting daily antihypertensive if BP remains elevated - TSH; Future  Return in about 1 month (around 01/14/2019) for follow up.  Orders Placed This Encounter  Procedures  . TSH    Standing Status:   Future    Standing Expiration Date:   12/14/2019    Requested Prescriptions   Signed Prescriptions Disp Refills  . venlafaxine XR (EFFEXOR-XR) 75 MG 24 hr capsule 60 capsule 1    Sig: Take 2 capsules (150 mg total) by mouth daily with breakfast.

## 2019-01-20 MED FILL — VENLAFAXINE HCL ER 75 MG CA: 75 | 30 days supply | Qty: 60 | Fill #1

## 2019-02-21 MED FILL — busPIRone HCL 15 MG TABS: 15 | 90 days supply | Qty: 90 | Fill #0

## 2019-02-23 ENCOUNTER — Other Ambulatory Visit: Payer: Self-pay | Admitting: *Deleted

## 2019-02-23 DIAGNOSIS — F32A Depression, unspecified: Secondary | ICD-10-CM

## 2019-02-23 DIAGNOSIS — F329 Major depressive disorder, single episode, unspecified: Secondary | ICD-10-CM

## 2019-02-23 DIAGNOSIS — F419 Anxiety disorder, unspecified: Secondary | ICD-10-CM

## 2019-02-23 MED ORDER — VENLAFAXINE HCL ER 75 MG PO CP24: 150.0000 mg | ORAL_CAPSULE | Freq: Every day | ORAL | 1 refills | Status: DC

## 2019-02-23 MED FILL — VENLAFAXINE HCL ER 75 MG CA: 75 | 30 days supply | Qty: 60 | Fill #0

## 2019-03-31 MED FILL — VENLAFAXINE HCL ER 75 MG CA: 75 | 30 days supply | Qty: 60 | Fill #1

## 2019-05-03 ENCOUNTER — Other Ambulatory Visit: Payer: Self-pay | Admitting: Internal Medicine

## 2019-05-03 ENCOUNTER — Telehealth: Payer: Self-pay

## 2019-05-03 DIAGNOSIS — F329 Major depressive disorder, single episode, unspecified: Secondary | ICD-10-CM

## 2019-05-03 DIAGNOSIS — F32A Depression, unspecified: Secondary | ICD-10-CM

## 2019-05-03 NOTE — Telephone Encounter (Signed)
Copied from [place] [id number]. Topic: General - Other >> May 03, 2019  3:05 PM Antonieta Iba C wrote: Reason for CRM: pt called in to schedule a TOC with Mickel Baas, suggested a different location due to provider not currently taking np. Pt asked that I send message anyway to ask and would try a different location if not accepted by provider. >> May 03, 2019  3:23 PM Para Skeans A wrote: Please advise if she accepts.

## 2019-05-05 ENCOUNTER — Telehealth (INDEPENDENT_AMBULATORY_CARE_PROVIDER_SITE_OTHER): Payer: Self-pay | Admitting: Family Medicine

## 2019-05-05 ENCOUNTER — Other Ambulatory Visit: Payer: Self-pay

## 2019-05-05 ENCOUNTER — Encounter: Payer: Self-pay | Admitting: Family Medicine

## 2019-05-05 VITALS — Ht 63.0 in | Wt 110.0 lb

## 2019-05-05 DIAGNOSIS — F419 Anxiety disorder, unspecified: Secondary | ICD-10-CM

## 2019-05-05 DIAGNOSIS — R030 Elevated blood-pressure reading, without diagnosis of hypertension: Secondary | ICD-10-CM

## 2019-05-05 DIAGNOSIS — F329 Major depressive disorder, single episode, unspecified: Secondary | ICD-10-CM

## 2019-05-05 MED ORDER — VENLAFAXINE HCL ER 150 MG PO CP24: 150.0000 mg | ORAL_CAPSULE | Freq: Every day | ORAL | 3 refills | Status: DC

## 2019-05-05 MED FILL — VENLAFAXINE HCL ER 150 MG C: 150 | 90 days supply | Qty: 90 | Fill #0

## 2019-05-05 NOTE — Telephone Encounter (Signed)
Patient is already scheduled at [facility] location today;

## 2019-05-05 NOTE — Progress Notes (Signed)
Virtual Visit via Video Note  I connected with Allison Ewing on 05/05/19 at  4:00 PM EDT by a video enabled telemedicine application and verified that I am speaking with the correct person using two identifiers. Location patient: home Location provider: work  Persons participating in the virtual visit: patient, provider  I discussed the limitations of evaluation and management by telemedicine and the availability of in person appointments. The patient expressed understanding and agreed to proceed.  Chief Complaint  Patient presents with  . Establish Care    est care/ medication refill effexor 75 mg. pt takes 2 in morning (asking if she can just get 150 mg tab so she can take just one)     HPI: Allison Ewing is a 33 y.o. female to transfer care to our office. Her [facility]. Prior to that, she was seeing Dr. Beverely Lowabori. Pt notes a h/o anxiety and depression. She is currently taking effexor 150mg  daily and feels this is effective at controlling her symptoms. She was on 75mg  previously but this was increased in 12/2018.  Pt has been taking buspar 15mg  BID PRN - she estimates that she takes this 1-2x/wk .  She has a fam h/o HTN and states her recent readings have been in 130-140's/90's. She does not check this often. No HA, dizziness, CP, SOB, LE edema.  She feels fine overall.   Past Medical History:  Diagnosis Date  . Anxiety   . Anxiety   . Depression   . Vaginal Pap smear, abnormal     Past Surgical History:  Procedure Laterality Date  . CESAREAN SECTION N/A 08/06/2014   Procedure: CESAREAN SECTION;  Surgeon: Mitchel HonourMegan Morris, DO;  Location: [facility];  Service: Obstetrics;  Laterality: N/A;  . DILATION AND CURETTAGE OF UTERUS  2007  . LASER ABLATION OF THE CERVIX    . LEEP      Family History  Problem Relation Age of Onset  . Diabetes Maternal Aunt   . Aneurysm Maternal Grandmother   . Cancer Maternal Grandfather        lung  .  Hypertension Paternal Grandmother   . Cancer Paternal Grandmother        cervical  . Stroke Paternal Grandmother   . Hyperlipidemia Mother   . Hypertension Mother   . Lung disease Father     Social History   Tobacco Use  . Smoking status: Former Smoker    Packs/day: 1.00    Types: Cigarettes    Quit date: 04/07/2016    Years since quitting: 3.0  . Smokeless tobacco: Never Used  Substance Use Topics  . Alcohol use: Yes    Alcohol/week: 6.0 standard drinks    Types: 6 Cans of beer per week    Comment: Weekly use -- BAC was 139 on admission  . Drug use: Yes    Frequency: 4.0 times per week    Types: Cocaine    Comment: 4x cocaine per week     Current Outpatient Medications:  .  busPIRone (BUSPAR) 15 MG tablet, Take 15 mg by mouth 2 (two) times daily. 1- 2 tabs daily as needed, Disp: , Rfl:  .  fluticasone (FLONASE) 50 MCG/ACT nasal spray, Place 2 sprays into both nostrils daily., Disp: 16 g, Rfl: 2 .  venlafaxine XR (EFFEXOR-XR) 75 MG 24 hr capsule, Take 2 capsules (150 mg total) by mouth daily with breakfast. NEED TO ESTABLISH CARE WITH NEW PROVIDER FOR FUTURE  REFILLS., Disp: 60 capsule, Rfl: 1  No Known Allergies   ROS: See pertinent positives and negatives per HPI.   EXAM:  VITALS per patient if applicable: Ht 5\' 3"  (1.6 m)   Wt 110 lb (49.9 kg)   BMI 19.49 kg/m    GENERAL: alert, oriented, appears well and in no acute distress  HEENT: atraumatic, conjunctiva clear, no obvious abnormalities on inspection of external nose and ears  NECK: normal movements of the head and neck  LUNGS: on inspection no signs of respiratory distress, breathing rate appears normal, no obvious gross SOB, gasping or wheezing, no conversational dyspnea  CV: no obvious cyanosis  MS: moves all visible extremities without noticeable abnormality  PSYCH/NEURO: pleasant and cooperative, speech and thought processing grossly intact   ASSESSMENT AND PLAN:  1. Anxiety and depression -  stable, well-controlled Refill: - venlafaxine XR (EFFEXOR-XR) 150 MG 24 hr capsule; Take 1 capsule (150 mg total) by mouth daily with breakfast.  Dispense: 90 capsule; Refill: 3 - cont buspar 15mg  BID PRN - pt takes this 1-2x/wk on average - f/u in 4-6 mo or sooner PRN  2. Elevated BP without diagnosis of hypertension - pt will keep home BP log and check BP 3-4x/wk x 2-3 wks and then make f/u appt with me - she is agreeable to Rx if her BP average is above goal     I discussed the assessment and treatment plan with the patient. The patient was provided an opportunity to ask questions and all were answered. The patient agreed with the plan and demonstrated an understanding of the instructions.   The patient was advised to call back or seek an in-person evaluation if the symptoms worsen or if the condition fails to improve as anticipated.   Letta Median, DO

## 2019-06-09 ENCOUNTER — Encounter: Payer: Self-pay | Admitting: Family Medicine

## 2019-06-09 MED ORDER — BUSPIRONE HCL 15 MG PO TABS: 15.0000 mg | ORAL_TABLET | Freq: Two times a day (BID) | ORAL | 3 refills | Status: DC

## 2019-06-16 ENCOUNTER — Other Ambulatory Visit: Payer: Self-pay | Admitting: Family Medicine

## 2019-06-16 ENCOUNTER — Telehealth: Payer: Self-pay

## 2019-06-16 MED ORDER — BUSPIRONE HCL 15 MG PO TABS: 15.0000 mg | ORAL_TABLET | Freq: Two times a day (BID) | ORAL | 3 refills | Status: DC | PRN

## 2019-06-16 MED FILL — busPIRone HCL 15 MG TABS: 15 | 90 days supply | Qty: 180 | Fill #0

## 2019-06-16 NOTE — Telephone Encounter (Signed)
Reviewed Dr. Vivia Ewing last OV note.  Per note "cont buspar 15mg  BID PRN".  Rx re-sent to reflect current dosing.

## 2019-06-16 NOTE — Telephone Encounter (Signed)
Dr. Zigmund Daniel can you please help me with this on the absence of Dr. Loletha Grayer

## 2019-06-16 NOTE — Telephone Encounter (Signed)
Sent pt mychart message letting pt know of the new prescription has been sent.

## 2019-06-16 NOTE — Telephone Encounter (Signed)
[facility] called [facility]/there are 2 sets of directions on the BuSpar and they need clarification/I advised them that this will be corrected by provider and resent to them/currently it says "15mg , oral, 2 times daily. 1-2 tabs daily as needed"  So they are unsure if it is "take bid; may take 1-2 tabs additionally prn"/thx dmf

## 2019-06-28 ENCOUNTER — Other Ambulatory Visit: Payer: Self-pay

## 2019-06-28 ENCOUNTER — Other Ambulatory Visit: Payer: Self-pay | Admitting: Cardiology

## 2019-06-28 ENCOUNTER — Telehealth (INDEPENDENT_AMBULATORY_CARE_PROVIDER_SITE_OTHER): Payer: Self-pay | Admitting: Nurse Practitioner

## 2019-06-28 ENCOUNTER — Encounter: Payer: Self-pay | Admitting: Nurse Practitioner

## 2019-06-28 VITALS — Temp 98.8°F | Ht 63.0 in

## 2019-06-28 DIAGNOSIS — R5383 Other fatigue: Secondary | ICD-10-CM

## 2019-06-28 DIAGNOSIS — Z20822 Contact with and (suspected) exposure to covid-19: Secondary | ICD-10-CM

## 2019-06-28 DIAGNOSIS — M791 Myalgia, unspecified site: Secondary | ICD-10-CM

## 2019-06-28 DIAGNOSIS — Z20828 Contact with and (suspected) exposure to other viral communicable diseases: Secondary | ICD-10-CM

## 2019-06-28 DIAGNOSIS — R6889 Other general symptoms and signs: Secondary | ICD-10-CM

## 2019-06-28 DIAGNOSIS — R0602 Shortness of breath: Secondary | ICD-10-CM

## 2019-06-28 DIAGNOSIS — R51 Headache: Secondary | ICD-10-CM

## 2019-06-28 DIAGNOSIS — R197 Diarrhea, unspecified: Secondary | ICD-10-CM

## 2019-06-28 NOTE — Progress Notes (Signed)
Virtual Visit via Video Note  I connected with Allison Ewing on 06/28/19 at 11:00 AM EDT by a video enabled telemedicine application and verified that I am speaking with the correct person using two identifiers.  Location: Patient: in private vehicle Provider: office   I discussed the limitations of evaluation and management by telemedicine and the availability of in person appointments. The patient expressed understanding and agreed to proceed.  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   History of Present Illness: URI  This is a new problem. The current episode started in the past 7 days. The problem has been unchanged. The maximum temperature recorded prior to her arrival was 100.4 - 100.9 F. The fever has been present for 1 to 2 days. Associated symptoms include congestion, diarrhea, headaches, joint pain, rhinorrhea and sinus pain. Pertinent negatives include no abdominal pain, chest pain, coughing, dysuria, ear pain, joint swelling, nausea, neck pain, plugged ear sensation, rash, sneezing, sore throat, swollen glands, vomiting or wheezing. She has tried acetaminophen and increased fluids for the symptoms. The treatment provided no relief.  2episodes of diarrhea yesterday and 1episode today, no melena, no hematochezia. Has intermittent SOB with exertion, no chest pain, no PND Coworker tested positive for COVID and is symptomatic.  Observations/Objective: Physical Exam  Constitutional: She is oriented to person, place, and time. No distress.  Pulmonary/Chest: Effort normal.  Neurological: She is alert and oriented to person, place, and time.  Skin: She is not diaphoretic.  limited exam due to video appt. Unable to provide complete vital signs.  Assessment and Plan: [name] was seen today for  headache.  Diagnoses and all orders for this visit:  Flu-like symptoms -     Novel Coronavirus, NAA ([facility]) -     MYCHART COVID-19 HOME MONITORING PROGRAM -     Temperature monitoring; Future  Close Exposure to Covid-19 Virus -     Novel Coronavirus, NAA ([facility]) -     MYCHART COVID-19 HOME MONITORING PROGRAM -     Temperature monitoring; Future   Follow Up Instructions: See avs   I discussed the assessment and treatment plan with the patient. The patient was provided an opportunity to ask questions and all were answered. The patient agreed with the plan and demonstrated an understanding of the instructions.   The patient was advised to call back or seek an in-person evaluation if the symptoms worsen or if the condition fails to improve as anticipated.  Wilfred Lacy, NP

## 2019-06-28 NOTE — Addendum Note (Signed)
Addended by: Jeanet Lupe E on: 06/28/2019 12:51 PM   Modules accepted: Orders  

## 2019-06-28 NOTE — Patient Instructions (Addendum)
Go to [employer] test site to test. Maintain adequate oral hydration and BRAT diet to help manage diarrhea. Use tylenol 500-1000mg  every 6hrs as needed for pain and fever. Go to hospital if symptoms worsen.  This information is directly available on the CDC website: RunningShows.co.za.html    Source:CDC Reference to specific commercial products, manufacturers, companies, or trademarks does not constitute its endorsement or recommendation by the [place], [place], or Centers for [employer] and Prevention.

## 2019-06-29 LAB — NOVEL CORONAVIRUS, NAA: SARS-CoV-2, NAA: NOT DETECTED

## 2019-08-02 MED FILL — VENLAFAXINE HCL ER 150 MG C: 150 | 90 days supply | Qty: 90 | Fill #1

## 2019-08-09 IMAGING — CT CT HEAD W/O CM
3 of 4 series · 15 of 47 positions shown, 18 images · non-contrast
Comparison: None.

CLINICAL DATA: Headache.  Fall today

EXAM:
CT HEAD WITHOUT CONTRAST
TECHNIQUE: Contiguous axial images were obtained from the base of the skull
through the vertex without intravenous contrast.

[Series 2: head w/o · axial · non-contrast · 0.45mm/px · z∈[-136,-16]mm · 9 of 30 slices shown, 12 images]
[im 3/30  brain]
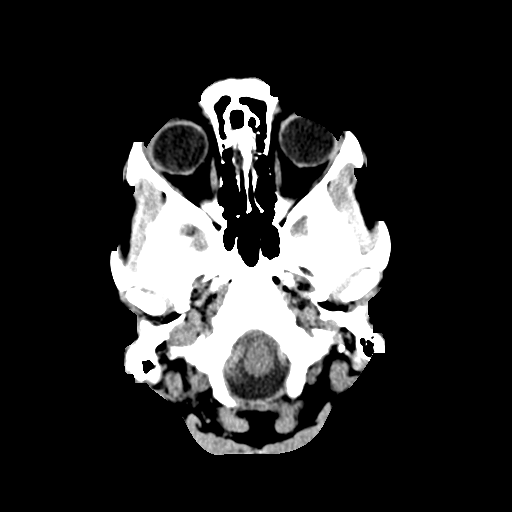
[im 3/30  bone]
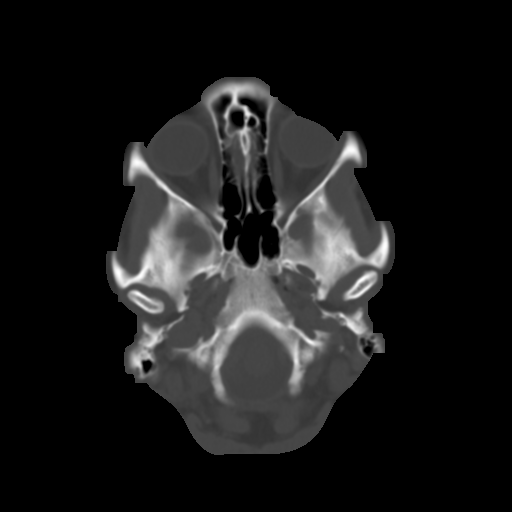
[im 6/30  brain]
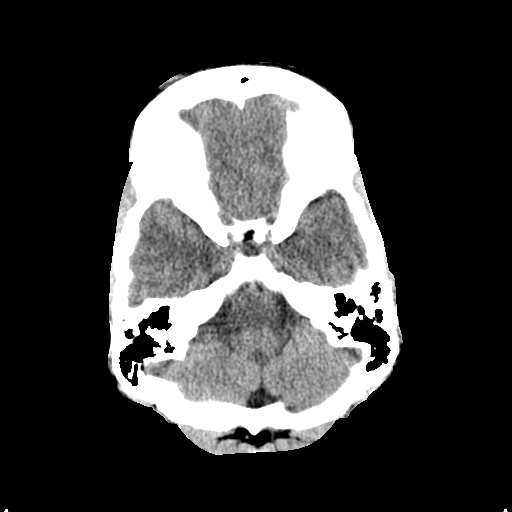
[im 9/30  brain]
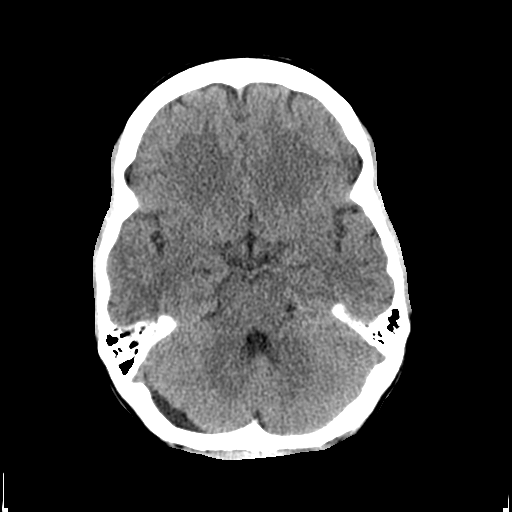
[im 12/30  brain]
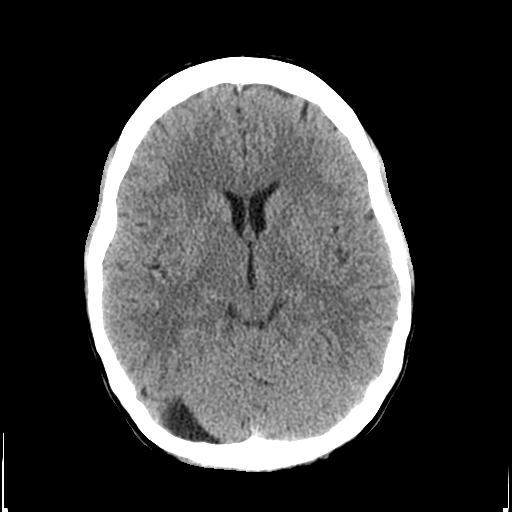
[im 15/30  brain]
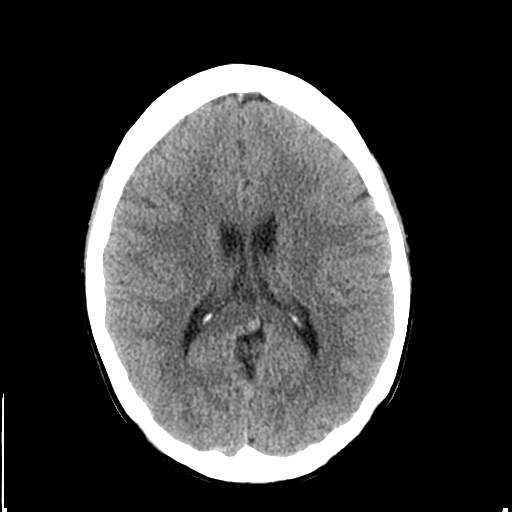
[im 15/30  bone]
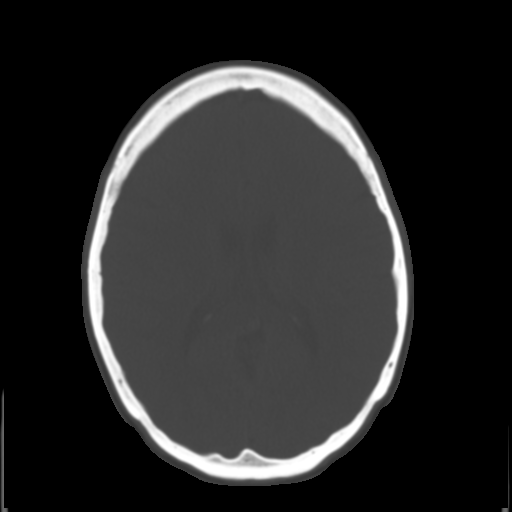
[im 18/30  brain]
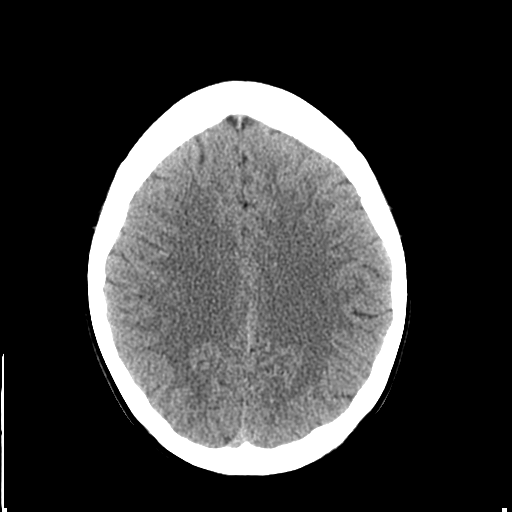
[im 21/30  brain]
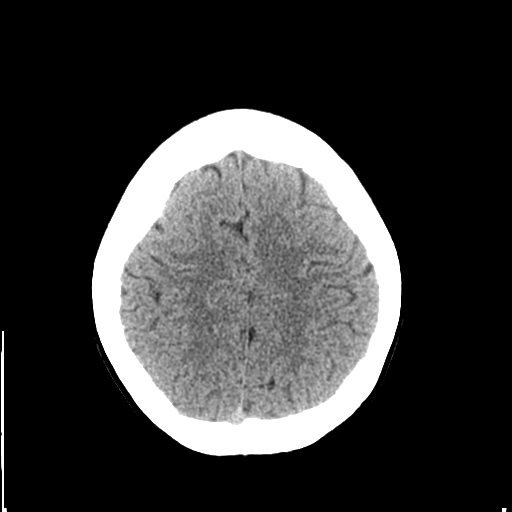
[im 24/30  brain]
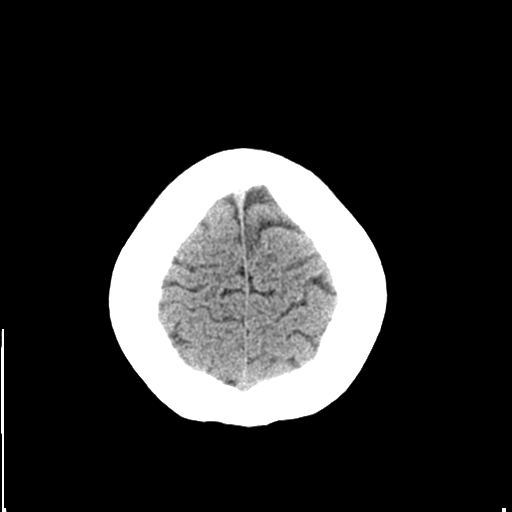
[im 27/30  brain]
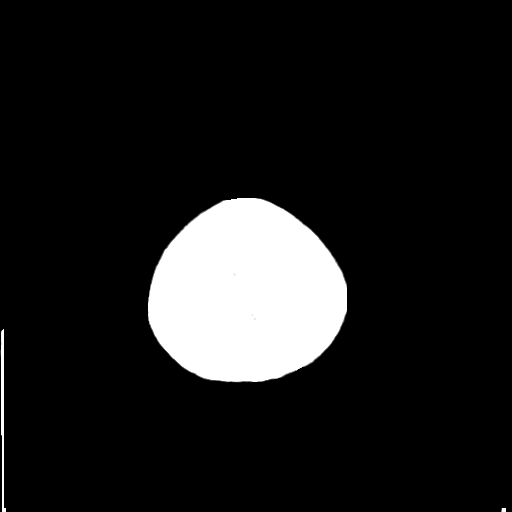
[im 27/30  bone]
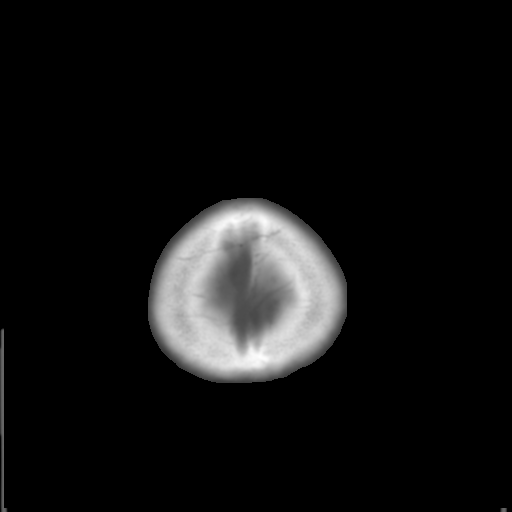

[Series 4: coronal · coronal · 0.30mm/px · 3 of 69 slices shown]
[im 23/69  brain]
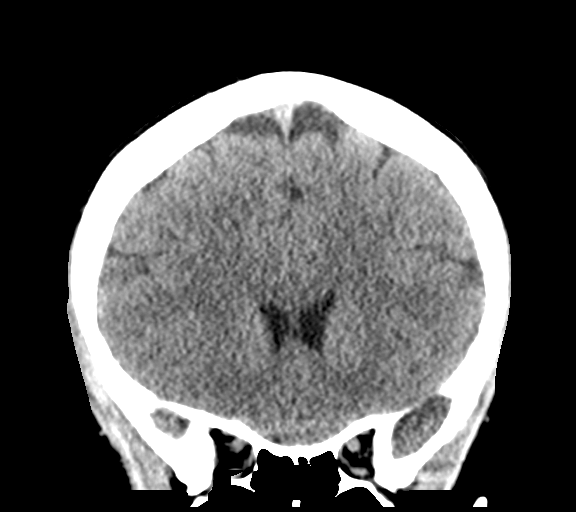
[im 31/69  brain]
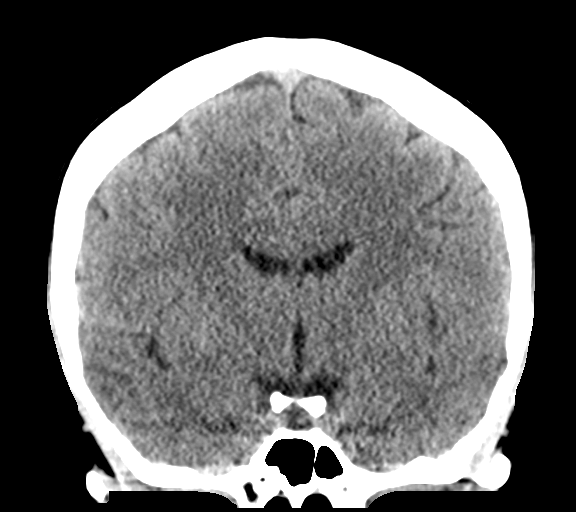
[im 38/69  brain]
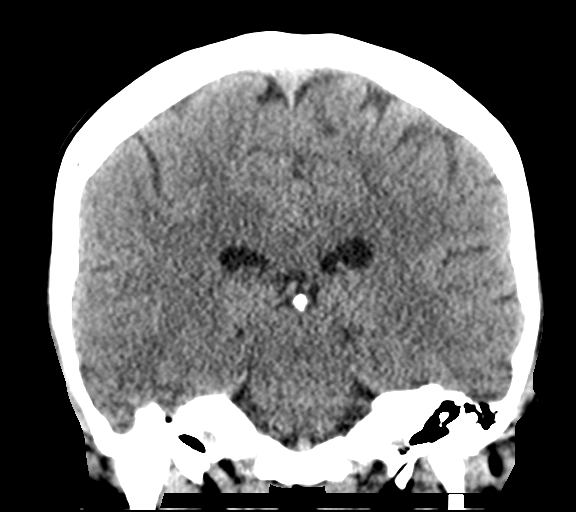

[Series 5: sagittal · sagittal · 0.28mm/px · 3 of 59 slices shown]
[im 20/59  brain]
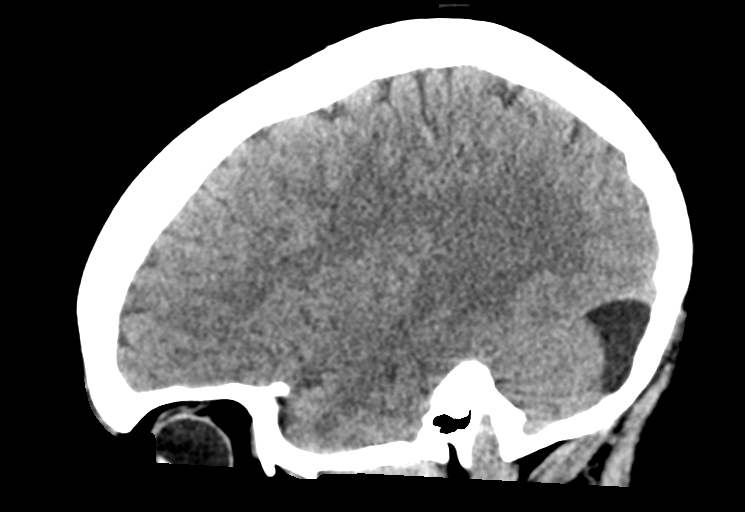
[im 30/59  brain]
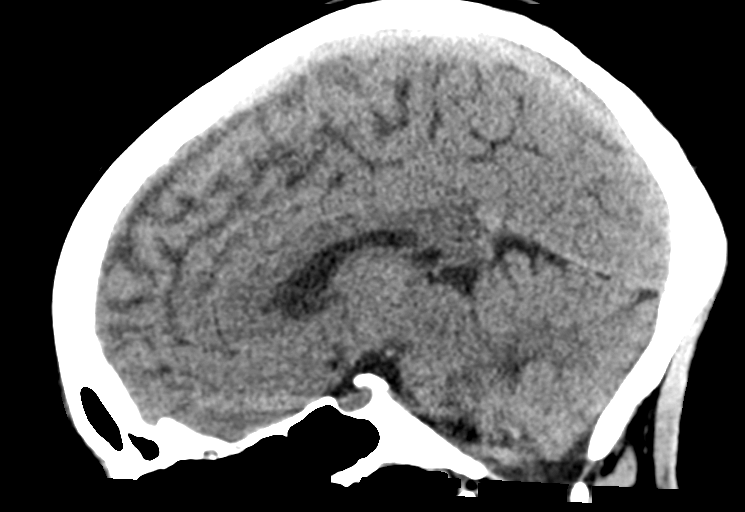
[im 39/59  brain]
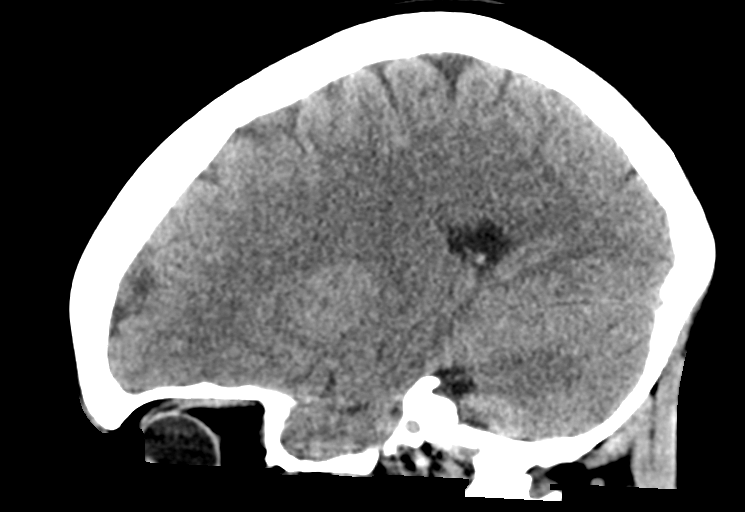

[15 of 47 positions shown; findings below may reference images not displayed]

FINDINGS: Brain: Ventricle size normal. Negative for infarct, hemorrhage, or
mass lesion. Extra-axial fluid collection in the posterior fossa on
the right lateral to the cerebellum most likely arachnoid cyst
measuring approximately 17 x 26 mm. No mass-effect on the fourth
ventricle

Vascular: Negative for hyperdense vessel

Skull: Negative

Sinuses/Orbits: Negative

Other: None
IMPRESSION: No acute abnormality. Water density posterior fossa cyst on the
right most consistent with arachnoid cyst.

## 2019-11-15 MED FILL — VENLAFAXINE HCL ER 150 MG C: 150 | 90 days supply | Qty: 90 | Fill #2

## 2019-11-15 MED FILL — busPIRone HCL 15 MG TABS: 15 | 90 days supply | Qty: 180 | Fill #1

## 2019-11-30 ENCOUNTER — Ambulatory Visit: Payer: Self-pay

## 2019-12-04 ENCOUNTER — Ambulatory Visit: Payer: Self-pay | Attending: Family

## 2019-12-04 DIAGNOSIS — Z23 Encounter for immunization: Secondary | ICD-10-CM | POA: Insufficient documentation

## 2019-12-04 NOTE — Progress Notes (Signed)
   Covid-19 Vaccination Clinic  Name:  JUDE LINCK    MRN: [id number] DOB: 1986/06/05  12/04/2019  Ms. Gora was observed post Covid-19 immunization for 15 minutes without incidence. She was provided with Vaccine Information Sheet and instruction to access the V-Safe system.   Ms. Urbanik was instructed to call 911 with any severe reactions post vaccine: Marland Kitchen Difficulty breathing  . Swelling of your face and throat  . A fast heartbeat  . A bad rash all over your body  . Dizziness and weakness    Immunizations Administered    Name Date Dose VIS Date Route   Moderna COVID-19 Vaccine 12/04/2019  3:22 PM 0.5 mL 09/12/2019 Intramuscular   Manufacturer: Moderna   Lot: [id number]   NDC: 84037-543-60

## 2020-01-16 ENCOUNTER — Ambulatory Visit: Payer: Self-pay | Attending: Family

## 2020-01-16 DIAGNOSIS — Z23 Encounter for immunization: Secondary | ICD-10-CM

## 2020-01-16 NOTE — Progress Notes (Signed)
   Covid-19 Vaccination Clinic  Name:  Allison Ewing    MRN: [id number] DOB: [birth year]  01/16/2020  [name] was observed post Covid-19 immunization for 15 minutes without incident. She was provided with Vaccine Information Sheet and instruction to access the V-Safe system.   [name] was instructed to call 911 with any severe reactions post vaccine: Marland Kitchen Difficulty breathing  . Swelling of face and throat  . A fast heartbeat  . A bad rash all over body  . Dizziness and weakness   Immunizations Administered    Name Date Dose VIS Date Route   Moderna COVID-19 Vaccine 01/16/2020 10:35 AM 0.5 mL 09/12/2019 Intramuscular   Manufacturer: Moderna   Lot: [id number]   NDC: 92178-375-42

## 2020-01-24 ENCOUNTER — Ambulatory Visit (INDEPENDENT_AMBULATORY_CARE_PROVIDER_SITE_OTHER)
Admission: RE | Admit: 2020-01-24 | Discharge: 2020-01-24 | Disposition: A | Discharge status: Home / Self Care | Payer: Self-pay | Source: Ambulatory Visit

## 2020-01-24 DIAGNOSIS — J0100 Acute maxillary sinusitis, unspecified: Secondary | ICD-10-CM

## 2020-01-24 DIAGNOSIS — N898 Other specified noninflammatory disorders of vagina: Secondary | ICD-10-CM

## 2020-01-24 MED ORDER — AZITHROMYCIN 250 MG PO TABS: 250.0000 mg | ORAL_TABLET | Freq: Every day | ORAL | 0 refills | Status: DC

## 2020-01-24 MED ORDER — FLUCONAZOLE 150 MG PO TABS: 150.0000 mg | ORAL_TABLET | Freq: Every day | ORAL | 0 refills | Status: DC

## 2020-01-24 MED FILL — FLUCONAZOLE 150 MG TABS: 150 | 3 days supply | Qty: 2 | Fill #0

## 2020-01-24 MED FILL — AZITHROMYCIN 250 MG TABLET: 250 | 5 days supply | Qty: 6 | Fill #0

## 2020-01-24 NOTE — ED Provider Notes (Signed)
Virtual Visit via Video Note:  Allison Ewing  initiated request for Telemedicine visit with [facility] team. I connected with Allison Ewing  on 01/24/2020 at 11:15 AM  for a synchronized telemedicine visit using a video enabled HIPPA compliant telemedicine application. I verified that I am speaking with Allison Ewing  using two identifiers. Allison Balloon, NP  was physically located in a [facility]gent care site and Allison Ewing was located at a different location.   The limitations of evaluation and management by telemedicine as well as the availability of in-person appointments were discussed. Patient was informed that she  may incur a bill ( including co-pay) for this virtual visit encounter. Allison Ewing  expressed understanding and gave verbal consent to proceed with virtual visit.     History of Present Illness:Allison Ewing  is a 34 y.o. female presents for evaluation of 3 day history of thick white vaginal discharge.  She also reports vaginal itching and irritation. This is similar to previous yeast infections.  She denies sexual activity x 4 months.  She also reports 10 days of cough productive of green phlegm, sinus pressure, congestion, sinus headache, sore throat, and post nasal drip.  No fever or chills.  She has been treating her symptoms with OTC sinus medication.  She denies pregnancy or breastfeeding.     No Known Allergies   Past Medical History:  Diagnosis Date  . Anxiety   . Anxiety   . Depression   . Vaginal Pap smear, abnormal      Social History   Tobacco Use  . Smoking status: Former Smoker    Packs/day: 1.00    Types: Cigarettes    Quit date: 04/07/2016    Years since quitting: 3.8  . Smokeless tobacco: Never Used  Substance Use Topics  . Alcohol use: Yes    Alcohol/week: 6.0 standard drinks    Types: 6 Cans of beer per week    Comment: Weekly use -- BAC was 139 on admission  . Drug use: Yes    Frequency: 4.0 times per week     Types: Cocaine    Comment: 4x cocaine per week    ROS: as stated in HPI.  All other systems reviewed and negative.      Observations/Objective: Physical Exam  VITALS: Patient denies fever. GENERAL: Alert, appears well and in no acute distress. HEENT: Atraumatic. NECK: Normal movements of the head and neck. CARDIOPULMONARY: No increased WOB. Speaking in clear sentences. I:E ratio WNL.  MS: Moves all visible extremities without noticeable abnormality. PSYCH: Pleasant and cooperative, well-groomed. Speech normal rate and rhythm. Affect is appropriate. Insight and judgement are appropriate. Attention is focused, linear, and appropriate.  NEURO: CN grossly intact. Oriented as arrived to appointment on time with no prompting. Moves both UE equally.  SKIN: No obvious lesions, wounds, erythema, or cyanosis noted on face or hands.   Assessment and Plan:    ICD-10-CM   1. Vaginal discharge  N89.8   2. Acute non-recurrent maxillary sinusitis  J01.00        Follow Up Instructions: Treating vaginal discharge with Diflucan as patient is certain this is similar to her previous yeast infections.  She denies concern for STDs.  Treating sinusitis with Zithromax; patient request Zithromax as this has previously worked for her; treating with antibiotic due to duration of symptoms.  Instructed patient to come for an in-person visit either with her PCP or here to  the urgent care if her symptoms are not improving.  Patient agrees to plan of care.    I discussed the assessment and treatment plan with the patient. The patient was provided an opportunity to ask questions and all were answered. The patient agreed with the plan and demonstrated an understanding of the instructions.   The patient was advised to call back or seek an in-person evaluation if the symptoms worsen or if the condition fails to improve as anticipated.      Mickie Bail, NP  01/24/2020 11:15 AM         Mickie Bail,  NP 01/24/20 1115

## 2020-04-21 ENCOUNTER — Telehealth: Payer: Self-pay | Admitting: Family

## 2020-04-21 DIAGNOSIS — B373 Candidiasis of vulva and vagina: Secondary | ICD-10-CM

## 2020-04-21 DIAGNOSIS — B3731 Acute candidiasis of vulva and vagina: Secondary | ICD-10-CM

## 2020-04-21 MED ORDER — FLUCONAZOLE 150 MG PO TABS: 150.0000 mg | ORAL_TABLET | ORAL | 0 refills | Status: DC | PRN

## 2020-04-21 NOTE — Progress Notes (Signed)

## 2020-06-06 ENCOUNTER — Other Ambulatory Visit: Payer: Self-pay

## 2020-07-21 ENCOUNTER — Telehealth: Payer: Self-pay | Admitting: Family

## 2020-07-21 DIAGNOSIS — B3731 Acute candidiasis of vulva and vagina: Secondary | ICD-10-CM

## 2020-07-21 DIAGNOSIS — B373 Candidiasis of vulva and vagina: Secondary | ICD-10-CM

## 2020-07-21 MED ORDER — FLUCONAZOLE 150 MG PO TABS: 150.0000 mg | ORAL_TABLET | ORAL | 0 refills | Status: DC | PRN

## 2020-07-21 NOTE — Progress Notes (Signed)

## 2021-03-05 ENCOUNTER — Telehealth: Payer: Self-pay | Admitting: Family

## 2021-03-05 DIAGNOSIS — J029 Acute pharyngitis, unspecified: Secondary | ICD-10-CM

## 2021-03-05 MED ORDER — AMOXICILLIN 500 MG PO CAPS: 500.0000 mg | ORAL_CAPSULE | Freq: Two times a day (BID) | ORAL | 0 refills | Status: AC

## 2021-03-05 MED ORDER — FLUCONAZOLE 150 MG PO TABS: 150.0000 mg | ORAL_TABLET | ORAL | 0 refills | Status: DC | PRN

## 2021-03-05 NOTE — Addendum Note (Signed)
Addended by: Jannifer Rodney A on: 03/05/2021 08:59 AM   Modules accepted: Orders

## 2021-03-05 NOTE — Progress Notes (Signed)

## 2021-08-19 ENCOUNTER — Other Ambulatory Visit (HOSPITAL_COMMUNITY): Payer: Self-pay

## 2021-08-26 ENCOUNTER — Other Ambulatory Visit (HOSPITAL_COMMUNITY): Payer: Self-pay

## 2021-08-26 MED ORDER — METHYLPHENIDATE HCL ER (OSM) 27 MG PO TBCR
27.0000 mg | EXTENDED_RELEASE_TABLET | Freq: Every morning | ORAL | 0 refills | Status: DC
Start: 2021-08-26 — End: 2022-06-29

## 2021-08-26 MED ORDER — METHYLPHENIDATE HCL ER (OSM) 27 MG PO TBCR
27.0000 mg | EXTENDED_RELEASE_TABLET | Freq: Every morning | ORAL | 0 refills | Status: DC
Start: 2021-08-26 — End: 2022-06-29
  Filled 2021-08-26: qty 30, 30d supply, fill #0

## 2021-12-10 DIAGNOSIS — I10 Essential (primary) hypertension: Secondary | ICD-10-CM | POA: Insufficient documentation

## 2022-06-29 ENCOUNTER — Ambulatory Visit: Payer: Self-pay | Attending: Family Medicine | Admitting: Family Medicine

## 2022-06-29 ENCOUNTER — Encounter: Payer: Self-pay | Admitting: Family Medicine

## 2022-06-29 VITALS — BP 157/108 | HR 71 | Ht 63.5 in | Wt 115.6 lb

## 2022-06-29 DIAGNOSIS — F909 Attention-deficit hyperactivity disorder, unspecified type: Secondary | ICD-10-CM

## 2022-06-29 DIAGNOSIS — Z1159 Encounter for screening for other viral diseases: Secondary | ICD-10-CM

## 2022-06-29 DIAGNOSIS — F419 Anxiety disorder, unspecified: Secondary | ICD-10-CM

## 2022-06-29 DIAGNOSIS — I10 Essential (primary) hypertension: Secondary | ICD-10-CM

## 2022-06-29 DIAGNOSIS — Z131 Encounter for screening for diabetes mellitus: Secondary | ICD-10-CM

## 2022-06-29 MED ORDER — AMLODIPINE BESYLATE 5 MG PO TABS: 5.0000 mg | ORAL_TABLET | Freq: Every day | ORAL | 1 refills | Status: DC

## 2022-06-29 NOTE — Patient Instructions (Signed)

## 2022-06-29 NOTE — Progress Notes (Signed)
Subjective:  Patient ID: Allison Ewing, female    DOB: 08/22/1986  Age: 36 y.o. MRN: [id number]  CC: New Patient (Initial Visit)   HPI [name] is a 36 y.o. year old female with a history of Anxiety, hypertension, ADHD  Interval History:  For her ADHD and anxiety she goes to the [facility] for Ritalin and Concerta and is also on hydroxyzine.  BP has been normal at about 125/70 until she was placed on OCP. Since she was placed on Junelle her BP has been running in the 722 systolic.  She has no headaches, blurry vision and is currently not on any medication for her hypertension. Denies additional concerns today. Past Medical History:  Diagnosis Date   Anxiety    Anxiety    Depression    Hypertension March 2023   Vaginal Pap smear, abnormal     Past Surgical History:  Procedure Laterality Date   CESAREAN SECTION N/A 08/06/2014   Procedure: CESAREAN SECTION;  Surgeon: Linda Hedges, DO;  Location: [facility];  Service: Obstetrics;  Laterality: N/A;   DILATION AND CURETTAGE OF UTERUS  2007   LASER ABLATION OF THE CERVIX     LEEP      Family History  Problem Relation Age of Onset   Diabetes Maternal Aunt    Aneurysm Maternal Grandmother    Cancer Maternal Grandfather        lung   Hypertension Paternal Grandmother    Cancer Paternal Grandmother        cervical   Stroke Paternal Grandmother    Heart disease Paternal Grandmother    Hyperlipidemia Mother    Hypertension Mother    Lung disease Father    Alcohol abuse Father    COPD Father    ADD / ADHD Brother    Anxiety disorder Brother    Diabetes Maternal Aunt    Learning disabilities Brother     Social History   Socioeconomic History   Marital status: Single    Spouse name: Not on file   Number of children: Not on file   Years of education: Not on file   Highest education level: Not on file  Occupational History   Not on file  Tobacco Use   Smoking status: Former    Packs/day: 0.50     Years: 10.00    Total pack years: 5.00    Types: Cigarettes, E-cigarettes    Quit date: 04/07/2016    Years since quitting: 6.2   Smokeless tobacco: Never  Vaping Use   Vaping Use: Never used  Substance and Sexual Activity   Alcohol use: Yes    Alcohol/week: 6.0 standard drinks of alcohol    Types: 6 Cans of beer per week    Comment: Weekly   Drug use: Not Currently    Frequency: 4.0 times per week    Types: Cocaine    Comment: 4x cocaine per week   Sexual activity: Yes    Birth control/protection: Pill  Other Topics Concern   Not on file  Social History Narrative   Not on file   Social Determinants of Health   Financial Resource Strain: Not on file  Food Insecurity: Not on file  Transportation Needs: Not on file  Physical Activity: Not on file  Stress: Not on file  Social Connections: Not on file    No Known Allergies  Outpatient Medications Prior to Visit  Medication Sig Dispense Refill   hydrOXYzine (ATARAX) 25 MG tablet  methylphenidate (CONCERTA) 36 MG PO CR tablet      methylphenidate (RITALIN) 5 MG tablet      fluticasone (FLONASE) 50 MCG/ACT nasal spray Place 2 sprays into both nostrils daily. 16 g 2   azithromycin (ZITHROMAX) 250 MG tablet Take 1 tablet (250 mg total) by mouth daily. Take first 2 tablets together, then 1 every day until finished. 6 tablet 0   busPIRone (BUSPAR) 15 MG tablet Take 1 tablet (15 mg total) by mouth 2 (two) times daily as needed. 180 tablet 3   fluconazole (DIFLUCAN) 150 MG tablet Take 1 tablet (150 mg total) by mouth every three (3) days as needed. 2 tablet 0   methylphenidate 27 MG PO CR tablet Take 1 tablet (27 mg total) by mouth every morning (fill 58 days after rx date) 30 tablet 0   methylphenidate 27 MG PO CR tablet Take 1 tablet (27 mg total) by mouth every morning (fill 28 days after rx date) 30 tablet 0   methylphenidate 27 MG PO CR tablet Take 1 tablet (27 mg total) by mouth every morning. 30 tablet 0   venlafaxine XR  (EFFEXOR-XR) 150 MG 24 hr capsule Take 1 capsule (150 mg total) by mouth daily with breakfast. 90 capsule 3   No facility-administered medications prior to visit.     ROS Review of Systems  Constitutional:  Negative for activity change and appetite change.  HENT:  Negative for sinus pressure and sore throat.   Respiratory:  Negative for chest tightness, shortness of breath and wheezing.   Cardiovascular:  Negative for chest pain and palpitations.  Gastrointestinal:  Negative for abdominal distention, abdominal pain and constipation.  Genitourinary: Negative.   Musculoskeletal: Negative.   Psychiatric/Behavioral:  Negative for behavioral problems and dysphoric mood.     Objective:  BP (!) 157/108   Pulse 71   Ht 5' 3.5" (1.613 m)   Wt 115 lb 9.6 oz (52.4 kg)   LMP 06/15/2022 (Approximate) Comment: 4 days period since starting birth control  SpO2 100%   BMI 20.16 kg/m      06/29/2022    9:04 AM 05/05/2019    3:50 PM 12/14/2018    3:05 PM  BP/Weight  Systolic BP 388  828  Diastolic BP 003  90  Wt. (Lbs) 115.6 110   BMI 20.16 kg/m2 19.49 kg/m2       Physical Exam Constitutional:      Appearance: She is well-developed.  Cardiovascular:     Rate and Rhythm: Normal rate.     Heart sounds: Normal heart sounds. No murmur heard. Pulmonary:     Effort: Pulmonary effort is normal.     Breath sounds: Normal breath sounds. No wheezing or rales.  Chest:     Chest wall: No tenderness.  Abdominal:     General: Bowel sounds are normal. There is no distension.     Palpations: Abdomen is soft. There is no mass.     Tenderness: There is no abdominal tenderness.  Musculoskeletal:        General: Normal range of motion.     Right lower leg: No edema.     Left lower leg: No edema.  Neurological:     Mental Status: She is alert and oriented to person, place, and time.  Psychiatric:        Mood and Affect: Mood normal.        Latest Ref Rng & Units 11/17/2018    3:27 PM  07/08/2017  6:29 PM 07/07/2017    8:47 AM  CMP  Glucose 70 - 99 mg/dL 74  93  102   BUN 6 - 23 mg/dL '14  15  6   ' Creatinine 0.40 - 1.20 mg/dL 0.76  0.78  0.69   Sodium 135 - 145 mEq/L 139  139  142   Potassium 3.5 - 5.1 mEq/L 3.7  3.9  3.1   Chloride 96 - 112 mEq/L 103  105  106   CO2 19 - 32 mEq/L '30  26  26   ' Calcium 8.4 - 10.5 mg/dL 9.1  8.9  9.6   Total Protein 6.0 - 8.3 g/dL 7.0   8.2   Total Bilirubin 0.2 - 1.2 mg/dL 0.4   0.1   Alkaline Phos 39 - 117 U/L 52   68   AST 0 - 37 U/L 17   26   ALT 0 - 35 U/L 17   19     Lipid Panel     Component Value Date/Time   CHOL 145 05/16/2016 0000   TRIG 76 05/16/2016 0000   HDL 62 05/16/2016 0000   LDLCALC 83 05/16/2016 0000    CBC    Component Value Date/Time   WBC 4.3 11/17/2018 1527   RBC 4.33 11/17/2018 1527   HGB 13.5 11/17/2018 1527   HCT 39.8 11/17/2018 1527   PLT 249.0 11/17/2018 1527   MCV 91.8 11/17/2018 1527   MCH 30.6 07/07/2017 0847   MCHC 33.9 11/17/2018 1527   RDW 12.3 11/17/2018 1527    No results found for: "HGBA1C"  Assessment & Plan:  1. Primary hypertension Uncontrolled We will initiate low-dose amlodipine Counseled on blood pressure goal of less than 130/80, low-sodium, DASH diet, medication compliance, 150 minutes of moderate intensity exercise per week. Discussed medication compliance, adverse effects. - amLODipine (NORVASC) 5 MG tablet; Take 1 tablet (5 mg total) by mouth daily.  Dispense: 90 tablet; Refill: 1 - CBC with Differential/Platelet - CMP14+EGFR  2. Anxiety Management as per behavioral health - hydrOXYzine (ATARAX) 25 MG tablet  3. Screening for viral disease - HCV Ab w Reflex to Quant PCR - HIV Antibody (routine testing w rflx)  4. Screening for diabetes mellitus - Hemoglobin A1c  5. Attention deficit hyperactivity disorder (ADHD), unspecified ADHD type Stable Management as per behavioral health - methylphenidate (CONCERTA) 36 MG PO CR tablet - methylphenidate (RITALIN)  5 MG tablet   Health Care Maintenance: She is up-to-date on Pap smear from 01/2022. Meds ordered this encounter  Medications   amLODipine (NORVASC) 5 MG tablet    Sig: Take 1 tablet (5 mg total) by mouth daily.    Dispense:  90 tablet    Refill:  1    Follow-up: Return in about 1 month (around 07/29/2022) for Hypertension.       Charlott Rakes, MD, FAAFP. [facility] and [place], [place]   06/29/2022, 1:18 PM

## 2022-06-29 NOTE — Progress Notes (Signed)
High blood pressure

## 2022-06-30 LAB — CBC WITH DIFFERENTIAL/PLATELET
Basophils Absolute: 0.1 10*3/uL (ref 0.0–0.2)
Basos: 1 %
EOS (ABSOLUTE): 0.4 10*3/uL (ref 0.0–0.4)
Eos: 7 %
Hematocrit: 37.2 % (ref 34.0–46.6)
Hemoglobin: 12.7 g/dL (ref 11.1–15.9)
Immature Grans (Abs): 0 10*3/uL (ref 0.0–0.1)
Immature Granulocytes: 0 %
Lymphocytes Absolute: 1.6 10*3/uL (ref 0.7–3.1)
Lymphs: 31 %
MCH: 31.1 pg (ref 26.6–33.0)
MCHC: 34.1 g/dL (ref 31.5–35.7)
MCV: 91 fL (ref 79–97)
Monocytes Absolute: 0.3 10*3/uL (ref 0.1–0.9)
Monocytes: 6 %
Neutrophils Absolute: 2.9 10*3/uL (ref 1.4–7.0)
Neutrophils: 55 %
Platelets: 265 10*3/uL (ref 150–450)
RBC: 4.08 x10E6/uL (ref 3.77–5.28)
RDW: 12.9 % (ref 11.7–15.4)
WBC: 5.3 10*3/uL (ref 3.4–10.8)

## 2022-06-30 LAB — HCV INTERPRETATION

## 2022-06-30 LAB — CMP14+EGFR
ALT: 22 IU/L (ref 0–32)
AST: 22 IU/L (ref 0–40)
Albumin/Globulin Ratio: 1.6 (ref 1.2–2.2)
Albumin: 4.3 g/dL (ref 3.9–4.9)
Alkaline Phosphatase: 49 IU/L (ref 44–121)
BUN/Creatinine Ratio: 19 (ref 9–23)
BUN: 13 mg/dL (ref 6–20)
Bilirubin Total: 0.3 mg/dL (ref 0.0–1.2)
CO2: 22 mmol/L (ref 20–29)
Calcium: 9.2 mg/dL (ref 8.7–10.2)
Chloride: 104 mmol/L (ref 96–106)
Creatinine, Ser: 0.7 mg/dL (ref 0.57–1.00)
Globulin, Total: 2.7 g/dL (ref 1.5–4.5)
Glucose: 85 mg/dL (ref 70–99)
Potassium: 4.1 mmol/L (ref 3.5–5.2)
Sodium: 138 mmol/L (ref 134–144)
Total Protein: 7 g/dL (ref 6.0–8.5)
eGFR: 115 mL/min/{1.73_m2} (ref >59)

## 2022-06-30 LAB — HIV ANTIBODY (ROUTINE TESTING W REFLEX): HIV Screen 4th Generation wRfx: NONREACTIVE

## 2022-06-30 LAB — HCV AB W REFLEX TO QUANT PCR: HCV Ab: NONREACTIVE

## 2022-06-30 LAB — HEMOGLOBIN A1C
Est. average glucose Bld gHb Est-mCnc: 91 mg/dL
Hgb A1c MFr Bld: 4.8 % (ref 4.8–5.6)

## 2022-07-30 ENCOUNTER — Ambulatory Visit: Payer: Medicaid Other | Admitting: Family Medicine

## 2022-09-25 ENCOUNTER — Encounter: Payer: Self-pay | Admitting: Nurse Practitioner

## 2022-09-25 ENCOUNTER — Ambulatory Visit: Payer: Self-pay | Attending: Nurse Practitioner | Admitting: Nurse Practitioner

## 2022-09-25 VITALS — BP 123/80 | HR 86 | Ht 63.5 in | Wt 116.8 lb

## 2022-09-25 DIAGNOSIS — R7989 Other specified abnormal findings of blood chemistry: Secondary | ICD-10-CM

## 2022-09-25 DIAGNOSIS — E559 Vitamin D deficiency, unspecified: Secondary | ICD-10-CM

## 2022-09-25 DIAGNOSIS — I10 Essential (primary) hypertension: Secondary | ICD-10-CM

## 2022-09-25 NOTE — Progress Notes (Signed)
Assessment & Plan:  Allison Ewing was seen today for hypertension.  Diagnoses and all orders for this visit:  Primary hypertension Continue amlodipine as prescribed.  Reminded to bring in blood pressure log for follow  up appointment.  RECOMMENDATIONS: DASH/Mediterranean Diets are healthier choices for HTN.    Abnormal TSH -     Thyroid Panel With TSH  Vitamin D deficiency disease -     VITAMIN D 25 Hydroxy (Vit-D Deficiency, Fractures)    Patient has been counseled on age-appropriate routine health concerns for screening and prevention. These are reviewed and up-to-date. Referrals have been placed accordingly. Immunizations are up-to-date or declined.    Subjective:   Chief Complaint  Patient presents with  . Hypertension   HPI Allison Ewing 36 y.o. female presents to office today for follow up to HTN  BP Readings from Last 3 Encounters:  09/25/22 123/80  06/29/22 (!) 157/108  12/14/18 140/90     Fatigue She notes a problem fhoifiju  ROS  Past Medical History:  Diagnosis Date  . Anxiety   . Anxiety   . Depression   . Hypertension March 2023  . Vaginal Pap smear, abnormal     Past Surgical History:  Procedure Laterality Date  . CESAREAN SECTION N/A 08/06/2014   Procedure: CESAREAN SECTION;  Surgeon: Mitchel Honour, DO;  Location: [facility];  Service: Obstetrics;  Laterality: N/A;  . DILATION AND CURETTAGE OF UTERUS  2007  . LASER ABLATION OF THE CERVIX    . LEEP      Family History  Problem Relation Age of Onset  . Diabetes Maternal Aunt   . Aneurysm Maternal Grandmother   . Cancer Maternal Grandfather        lung  . Hypertension Paternal Grandmother   . Cancer Paternal Grandmother        cervical  . Stroke Paternal Grandmother   . Heart disease Paternal Grandmother   . Hyperlipidemia Mother   . Hypertension Mother   . Lung disease Father   . Alcohol abuse Father   . COPD Father   . ADD / ADHD Brother   . Anxiety disorder Brother   . Diabetes  Maternal Aunt   . Learning disabilities Brother     Social History Reviewed with no changes to be made today.   Outpatient Medications Prior to Visit  Medication Sig Dispense Refill  . amLODipine (NORVASC) 5 MG tablet Take 1 tablet (5 mg total) by mouth daily. 90 tablet 1  . hydrOXYzine (ATARAX) 25 MG tablet     . fluticasone (FLONASE) 50 MCG/ACT nasal spray Place 2 sprays into both nostrils daily. 16 g 2  . methylphenidate (CONCERTA) 36 MG PO CR tablet     . methylphenidate (RITALIN) 5 MG tablet      No facility-administered medications prior to visit.    No Known Allergies     Objective:    BP 123/80   Pulse 86   Ht 5' 3.5" (1.613 m)   Wt 116 lb 12.8 oz (53 kg)   LMP 09/25/2022   SpO2 98%   BMI 20.37 kg/m  Wt Readings from Last 3 Encounters:  09/25/22 116 lb 12.8 oz (53 kg)  06/29/22 115 lb 9.6 oz (52.4 kg)  05/05/19 110 lb (49.9 kg)    Physical Exam       Patient has been counseled extensively about nutrition and exercise as well as the importance of adherence with medications and regular follow-up. The patient was given clear  instructions to go to ER or return to medical center if symptoms don't improve, worsen or new problems develop. The patient verbalized understanding.   Follow-up: No follow-ups on file.   Gildardo Pounds, FNP-BC [facility] and [place], [place]   09/25/2022, 5:04 PM

## 2022-09-25 NOTE — Progress Notes (Unsigned)
No concerns. 

## 2022-09-26 ENCOUNTER — Encounter: Payer: Self-pay | Admitting: Nurse Practitioner

## 2022-09-26 LAB — THYROID PANEL WITH TSH
Free Thyroxine Index: 2.1 (ref 1.2–4.9)
T3 Uptake Ratio: 32 % (ref 24–39)
T4, Total: 6.6 ug/dL (ref 4.5–12.0)
TSH: 0.527 u[IU]/mL (ref 0.450–4.500)

## 2022-09-26 LAB — VITAMIN D 25 HYDROXY (VIT D DEFICIENCY, FRACTURES): Vit D, 25-Hydroxy: 32 ng/mL (ref 30.0–100.0)

## 2022-10-09 ENCOUNTER — Telehealth: Payer: Self-pay | Admitting: Family Medicine

## 2022-10-09 DIAGNOSIS — B3731 Acute candidiasis of vulva and vagina: Secondary | ICD-10-CM

## 2022-10-09 MED ORDER — FLUCONAZOLE 150 MG PO TABS: 150.0000 mg | ORAL_TABLET | Freq: Once | ORAL | 0 refills | Status: AC

## 2022-10-09 NOTE — Progress Notes (Signed)

## 2022-12-09 ENCOUNTER — Telehealth: Payer: Self-pay | Admitting: Family

## 2022-12-09 DIAGNOSIS — B3731 Acute candidiasis of vulva and vagina: Secondary | ICD-10-CM

## 2022-12-09 MED ORDER — FLUCONAZOLE 150 MG PO TABS: 150.0000 mg | ORAL_TABLET | ORAL | 0 refills | Status: DC | PRN

## 2022-12-09 NOTE — Progress Notes (Signed)

## 2023-01-08 ENCOUNTER — Ambulatory Visit: Payer: Medicaid Other | Admitting: Internal Medicine

## 2023-01-12 ENCOUNTER — Encounter: Payer: Self-pay | Admitting: Family Medicine

## 2023-01-12 ENCOUNTER — Telehealth (HOSPITAL_BASED_OUTPATIENT_CLINIC_OR_DEPARTMENT_OTHER): Payer: Self-pay | Admitting: Family Medicine

## 2023-01-12 DIAGNOSIS — B3731 Acute candidiasis of vulva and vagina: Secondary | ICD-10-CM

## 2023-01-12 MED ORDER — FLUCONAZOLE 150 MG PO TABS: 150.0000 mg | ORAL_TABLET | ORAL | 1 refills | Status: DC

## 2023-01-12 NOTE — Patient Instructions (Signed)

## 2023-01-12 NOTE — Progress Notes (Signed)
Virtual Visit via Video Note  I connected with Allison Ewing, on 01/12/2023 at 8:54 AM by video enabled telemedicine device and verified that I am speaking with the correct person using two identifiers.   Consent: I discussed the limitations, risks, security and privacy concerns of performing an evaluation and management service by telemedicine and the availability of in person appointments. I also discussed with the patient that there may be a patient responsible charge related to this service. The patient expressed understanding and agreed to proceed.   Location of Patient: Home  Location of Provider: Clinic   Persons participating in Telemedicine visit: Miracle M Weedon Dr. Margarita Rana     History of Present Illness: Allison Ewing is a 37 y.o. year old female  with history of anxiety, hypertension, ADHD.    For the last 2 years every time after her period she has a yeast infection. Boric acid and other treatments just calm it down if her symptoms to recur. Symptoms include vaginal irritation, itching, white discharge, no smell.  She has had a couple of ED visits for treatment for vaginal candidiasis and has received Diflucan prescriptions. Past Medical History:  Diagnosis Date   Anxiety    Anxiety    Depression    Hypertension March 2023   Vaginal Pap smear, abnormal    No Known Allergies  Current Outpatient Medications on File Prior to Visit  Medication Sig Dispense Refill   amLODipine (NORVASC) 5 MG tablet Take 1 tablet (5 mg total) by mouth daily. 90 tablet 1   fluconazole (DIFLUCAN) 150 MG tablet Take 1 tablet (150 mg total) by mouth every three (3) days as needed. 3 tablet 0   hydrOXYzine (ATARAX) 25 MG tablet      No current facility-administered medications on file prior to visit.    ROS: See HPI  Observations/Objective: Awake, alert, oriented x3 Not in acute distress Normal mood      Latest Ref Rng & Units 06/29/2022    9:40 AM 11/17/2018    3:27 PM  07/08/2017    6:29 PM  CMP  Glucose 70 - 99 mg/dL 85  74  93   BUN 6 - 20 mg/dL 13  14  15    Creatinine 0.57 - 1.00 mg/dL 0.70  0.76  0.78   Sodium 134 - 144 mmol/L 138  139  139   Potassium 3.5 - 5.2 mmol/L 4.1  3.7  3.9   Chloride 96 - 106 mmol/L 104  103  105   CO2 20 - 29 mmol/L 22  30  26    Calcium 8.7 - 10.2 mg/dL 9.2  9.1  8.9   Total Protein 6.0 - 8.5 g/dL 7.0  7.0    Total Bilirubin 0.0 - 1.2 mg/dL 0.3  0.4    Alkaline Phos 44 - 121 IU/L 49  52    AST 0 - 40 IU/L 22  17    ALT 0 - 32 IU/L 22  17      Lipid Panel     Component Value Date/Time   CHOL 145 05/16/2016 0000   TRIG 76 05/16/2016 0000   HDL 62 05/16/2016 0000   LDLCALC 83 05/16/2016 0000    Lab Results  Component Value Date   HGBA1C 4.8 06/29/2022     Assessment and Plan: 1. Vaginal candidiasis Current vaginal candidiasis Symptoms occur mostly around her periods Will place on prophylactic treatment - fluconazole (DIFLUCAN) 150 MG tablet; Take 1 tablet (150 mg total)  by mouth once a week. For vaginal candidiasis prevention  Dispense: 12 tablet; Refill: 1   Follow Up Instructions: Keep previously scheduled appointment   I discussed the assessment and treatment plan with the patient. The patient was provided an opportunity to ask questions and all were answered. The patient agreed with the plan and demonstrated an understanding of the instructions.   The patient was advised to call back or seek an in-person evaluation if the symptoms worsen or if the condition fails to improve as anticipated.     I provided 13 minutes total of Telehealth time during this encounter including median intraservice time, reviewing previous notes, investigations, ordering medications, medical decision making, coordinating care and patient verbalized understanding at the end of the visit.     Charlott Rakes, MD, FAAFP. [facility] and [place], [place]   01/12/2023, 8:54  AM

## 2023-01-19 ENCOUNTER — Ambulatory Visit: Payer: Medicaid Other | Admitting: Family Medicine

## 2023-05-27 ENCOUNTER — Telehealth: Payer: Self-pay | Admitting: Physician Assistant

## 2023-05-27 DIAGNOSIS — B9689 Other specified bacterial agents as the cause of diseases classified elsewhere: Secondary | ICD-10-CM

## 2023-05-27 DIAGNOSIS — N760 Acute vaginitis: Secondary | ICD-10-CM

## 2023-05-27 MED ORDER — METRONIDAZOLE 500 MG PO TABS: 500.0000 mg | ORAL_TABLET | Freq: Two times a day (BID) | ORAL | 0 refills | Status: AC

## 2023-05-27 NOTE — Progress Notes (Signed)
E-Visit for Vaginal Symptoms  We are sorry that you are not feeling well. Here is how we plan to help! Based on what you shared with me it looks like you: May have a vaginosis due to bacteria  Vaginosis is an inflammation of the vagina that can result in discharge, itching and pain. The cause is usually a change in the normal balance of vaginal bacteria or an infection. Vaginosis can also result from reduced estrogen levels after menopause.  The most common causes of vaginosis are:   Bacterial vaginosis which results from an overgrowth of one on several organisms that are normally present in your vagina.   Yeast infections which are caused by a naturally occurring fungus called candida.   Vaginal atrophy (atrophic vaginosis) which results from the thinning of the vagina from reduced estrogen levels after menopause.   Trichomoniasis which is caused by a parasite and is commonly transmitted by sexual intercourse.  Factors that increase your risk of developing vaginosis include: Medications, such as antibiotics and steroids Uncontrolled diabetes Use of hygiene products such as bubble bath, vaginal spray or vaginal deodorant Douching Wearing damp or tight-fitting clothing Using an intrauterine device (IUD) for birth control Hormonal changes, such as those associated with pregnancy, birth control pills or menopause Sexual activity Having a sexually transmitted infection  Your treatment plan is Metronidazole or Flagyl 500mg twice a day for 7 days.  I have electronically sent this prescription into the pharmacy that you have chosen.  Be sure to take all of the medication as directed. Stop taking any medication if you develop a rash, tongue swelling or shortness of breath. Mothers who are breast feeding should consider pumping and discarding their breast milk while on these antibiotics. However, there is no consensus that infant exposure at these doses would be harmful.  Remember that  medication creams can weaken latex condoms. .   HOME CARE:  Good hygiene may prevent some types of vaginosis from recurring and may relieve some symptoms:  Avoid baths, hot tubs and whirlpool spas. Rinse soap from your outer genital area after a shower, and dry the area well to prevent irritation. Don't use scented or harsh soaps, such as those with deodorant or antibacterial action. Avoid irritants. These include scented tampons and pads. Wipe from front to back after using the toilet. Doing so avoids spreading fecal bacteria to your vagina.  Other things that may help prevent vaginosis include:  Don't douche. Your vagina doesn't require cleansing other than normal bathing. Repetitive douching disrupts the normal organisms that reside in the vagina and can actually increase your risk of vaginal infection. Douching won't clear up a vaginal infection. Use a latex condom. Both female and female latex condoms may help you avoid infections spread by sexual contact. Wear cotton underwear. Also wear pantyhose with a cotton crotch. If you feel comfortable without it, skip wearing underwear to bed. Yeast thrives in moist environments Your symptoms should improve in the next day or two.  GET HELP RIGHT AWAY IF:  You have pain in your lower abdomen ( pelvic area or over your ovaries) You develop nausea or vomiting You develop a fever Your discharge changes or worsens You have persistent pain with intercourse You develop shortness of breath, a rapid pulse, or you faint.  These symptoms could be signs of problems or infections that need to be evaluated by a medical provider now.  MAKE SURE YOU   Understand these instructions. Will watch your condition. Will get help right   away if you are not doing well or get worse.  Thank you for choosing an e-visit.  Your e-visit answers were reviewed by a board certified advanced clinical practitioner to complete your personal care plan. Depending upon the  condition, your plan could have included both over the counter or prescription medications.  Please review your pharmacy choice. Make sure the pharmacy is open so you can pick up prescription now. If there is a problem, you may contact your provider through MyChart messaging and have the prescription routed to another pharmacy.  Your safety is important to us. If you have drug allergies check your prescription carefully.   For the next 24 hours you can use MyChart to ask questions about today's visit, request a non-urgent call back, or ask for a work or school excuse. You will get an email in the next two days asking about your experience. I hope that your e-visit has been valuable and will speed your recovery.  I have spent 5 minutes in review of e-visit questionnaire, review and updating patient chart, medical decision making and response to patient.   Jennifer M Burnette, PA-C  

## 2023-08-17 ENCOUNTER — Ambulatory Visit: Payer: Self-pay | Attending: Family Medicine | Admitting: Family Medicine

## 2023-08-17 VITALS — BP 157/98 | HR 75 | Ht 63.5 in | Wt 120.0 lb

## 2023-08-17 DIAGNOSIS — B3731 Acute candidiasis of vulva and vagina: Secondary | ICD-10-CM

## 2023-08-17 DIAGNOSIS — I10 Essential (primary) hypertension: Secondary | ICD-10-CM

## 2023-08-17 MED ORDER — FLUCONAZOLE 150 MG PO TABS: 150.0000 mg | ORAL_TABLET | ORAL | 1 refills | Status: DC

## 2023-08-17 NOTE — Patient Instructions (Signed)
VISIT SUMMARY:  During today's visit, we discussed your recent experience with stopping your blood pressure medication due to fatigue and low readings. We also reviewed your management of recurrent yeast infections and planned for general health maintenance.  YOUR PLAN:  -HYPERTENSION: Hypertension, or high blood pressure, occurs when the force of blood against your artery walls is too high. You stopped your medication because it made you feel tired and caused low blood pressure. Your blood pressure was elevated today, but this may be due to coffee and smoking. Please monitor your blood pressure at home and keep a log. If your readings are consistently above 130/80, we may need to restart your medication. We will reassess in one month.  -RECURRENT YEAST INFECTIONS: Yeast infections are caused by an overgrowth of yeast in the body, leading to itching and discomfort. You have been managing this with Diflucan after your menstrual cycle, which has been effective. We will continue this regimen and refill your prescription at [facility] on [address].  -GENERAL HEALTH MAINTENANCE: We will conduct routine tests to check your electrolytes, kidney, and liver function. These tests help ensure your overall health is maintained. Please schedule a follow-up appointment in six months.  INSTRUCTIONS:  Please monitor your blood pressure at home and keep a log of your readings. Return in one month for a blood pressure reassessment and review of your home blood pressure log. Additionally, schedule a follow-up appointment in six months for general health maintenance.

## 2023-08-17 NOTE — Progress Notes (Signed)
Subjective:  Patient ID: Allison Ewing, female    DOB: 05-02-1986  Age: 37 y.o. MRN: [id number]  CC: Medical Management of Chronic Issues (Stopped BP medications/Refill on Diflucan)   HPI Allison Ewing is a 38 y.o. year old female with a history of, ADHD hypertension.  Interval History: Discussed the use of AI scribe software for clinical note transcription with the patient, who gave verbal consent to proceed.  She presents after discontinuing her blood pressure medication due to symptoms of fatigue and hypotension. She reports that while on the medication, her blood pressure would drop to 100/60, causing her to feel 'real tired.' Since discontinuing the medication, she has been monitoring her blood pressure at work, which averages 125/82.  Her blood pressure is elevated today and she just smoked and drank a cup of coffee prior to this visit.   In addition, the patient has been managing recurrent yeast infections with a six-month regimen of Diflucan, taken a day or two after the end of her menstrual cycle. She reports that this regimen has been effective, with rare need for a second dose.        Past Medical History:  Diagnosis Date   Anxiety    Anxiety    Depression    Hypertension March 2023   Vaginal Pap smear, abnormal     Past Surgical History:  Procedure Laterality Date   CESAREAN SECTION N/A 08/06/2014   Procedure: CESAREAN SECTION;  Surgeon: Mitchel Honour, DO;  Location: [facility];  Service: Obstetrics;  Laterality: N/A;   DILATION AND CURETTAGE OF UTERUS  2007   LASER ABLATION OF THE CERVIX     LEEP      Family History  Problem Relation Age of Onset   Diabetes Maternal Aunt    Aneurysm Maternal Grandmother    Cancer Maternal Grandfather        lung   Hypertension Paternal Grandmother    Cancer Paternal Grandmother        cervical   Stroke Paternal Grandmother    Heart disease Paternal Grandmother    Hyperlipidemia Mother    Hypertension Mother    Lung  disease Father    Alcohol abuse Father    COPD Father    ADD / ADHD Brother    Anxiety disorder Brother    Diabetes Maternal Aunt    Learning disabilities Brother     Social History   Socioeconomic History   Marital status: Single    Spouse name: Not on file   Number of children: Not on file   Years of education: Not on file   Highest education level: Associate degree: occupational, Scientist, product/process development, or vocational program  Occupational History   Not on file  Tobacco Use   Smoking status: Former    Current packs/day: 0.00    Average packs/day: 0.5 packs/day for 10.0 years (5.0 ttl pk-yrs)    Types: Cigarettes, E-cigarettes    Start date: 04/07/2006    Quit date: 04/07/2016    Years since quitting: 7.3   Smokeless tobacco: Never  Vaping Use   Vaping status: Some Days  Substance and Sexual Activity   Alcohol use: Yes    Alcohol/week: 6.0 standard drinks of alcohol    Types: 6 Cans of beer per week    Comment: Weekly   Drug use: Not Currently    Frequency: 4.0 times per week    Types: Cocaine    Comment: 4x cocaine per week   Sexual activity: Yes  Birth control/protection: Pill  Other Topics Concern   Not on file  Social History Narrative   Not on file   Social Determinants of Health   Financial Resource Strain: Medium Risk (08/17/2023)   Overall Financial Resource Strain (CARDIA)    Difficulty of Paying Living Expenses: Somewhat hard  Food Insecurity: No Food Insecurity (08/17/2023)   Hunger Vital Sign    Worried About Running Out of Food in the Last Year: Never true    Ran Out of Food in the Last Year: Never true  Transportation Needs: No Transportation Needs (08/17/2023)   PRAPARE - Administrator, Civil Service (Medical): No    Lack of Transportation (Non-Medical): No  Physical Activity: Insufficiently Active (08/17/2023)   Exercise Vital Sign    Days of Exercise per Week: 3 days    Minutes of Exercise per Session: 30 min  Stress: No Stress Concern  Present (08/17/2023)   [employer] of Occupational Health - Occupational Stress Questionnaire    Feeling of Stress : Only a little  Social Connections: Socially Isolated (08/17/2023)   Social Connection and Isolation Panel [NHANES]    Frequency of Communication with Friends and Family: Three times a week    Frequency of Social Gatherings with Friends and Family: Once a week    Attends Religious Services: Never    Database administrator or Organizations: No    Attends Engineer, structural: Not on file    Marital Status: Never married    No Known Allergies  Outpatient Medications Prior to Visit  Medication Sig Dispense Refill   fluconazole (DIFLUCAN) 150 MG tablet Take 1 tablet (150 mg total) by mouth once a week. For vaginal candidiasis prevention 12 tablet 1   amLODipine (NORVASC) 5 MG tablet Take 1 tablet (5 mg total) by mouth daily. 90 tablet 1   hydrOXYzine (ATARAX) 25 MG tablet      No facility-administered medications prior to visit.     ROS Review of Systems  Constitutional:  Negative for activity change and appetite change.  HENT:  Negative for sinus pressure and sore throat.   Respiratory:  Negative for chest tightness, shortness of breath and wheezing.   Cardiovascular:  Negative for chest pain and palpitations.  Gastrointestinal:  Negative for abdominal distention, abdominal pain and constipation.  Genitourinary: Negative.   Musculoskeletal: Negative.   Psychiatric/Behavioral:  Negative for behavioral problems and dysphoric mood.     Objective:  BP (!) 157/98   Pulse 75   Ht 5' 3.5" (1.613 m)   Wt 120 lb (54.4 kg)   SpO2 100%   BMI 20.92 kg/m      08/17/2023    4:06 PM 08/17/2023    3:30 PM 09/25/2022    4:21 PM  BP/Weight  Systolic BP 157 152 123  Diastolic BP 98 100 80  Wt. (Lbs)  120 116.8  BMI  20.92 kg/m2 20.37 kg/m2      Physical Exam Constitutional:      Appearance: She is well-developed.  Cardiovascular:     Rate and Rhythm:  Normal rate.     Heart sounds: Normal heart sounds. No murmur heard. Pulmonary:     Effort: Pulmonary effort is normal.     Breath sounds: Normal breath sounds. No wheezing or rales.  Chest:     Chest wall: No tenderness.  Abdominal:     General: Bowel sounds are normal. There is no distension.     Palpations: Abdomen is soft. There  is no mass.     Tenderness: There is no abdominal tenderness.  Musculoskeletal:        General: Normal range of motion.     Right lower leg: No edema.     Left lower leg: No edema.  Neurological:     Mental Status: She is alert and oriented to person, place, and time.  Psychiatric:        Mood and Affect: Mood normal.        Latest Ref Rng & Units 06/29/2022    9:40 AM 11/17/2018    3:27 PM 07/08/2017    6:29 PM  CMP  Glucose 70 - 99 mg/dL 85  74  93   BUN 6 - 20 mg/dL 13  14  15    Creatinine 0.57 - 1.00 mg/dL 3.15  1.76  1.60   Sodium 134 - 144 mmol/L 138  139  139   Potassium 3.5 - 5.2 mmol/L 4.1  3.7  3.9   Chloride 96 - 106 mmol/L 104  103  105   CO2 20 - 29 mmol/L 22  30  26    Calcium 8.7 - 10.2 mg/dL 9.2  9.1  8.9   Total Protein 6.0 - 8.5 g/dL 7.0  7.0    Total Bilirubin 0.0 - 1.2 mg/dL 0.3  0.4    Alkaline Phos 44 - 121 IU/L 49  52    AST 0 - 40 IU/L 22  17    ALT 0 - 32 IU/L 22  17      Lipid Panel     Component Value Date/Time   CHOL 145 05/16/2016 0000   TRIG 76 05/16/2016 0000   HDL 62 05/16/2016 0000   LDLCALC 83 05/16/2016 0000    CBC    Component Value Date/Time   WBC 5.3 06/29/2022 0940   WBC 4.3 11/17/2018 1527   RBC 4.08 06/29/2022 0940   RBC 4.33 11/17/2018 1527   HGB 12.7 06/29/2022 0940   HCT 37.2 06/29/2022 0940   PLT 265 06/29/2022 0940   MCV 91 06/29/2022 0940   MCH 31.1 06/29/2022 0940   MCH 30.6 07/07/2017 0847   MCHC 34.1 06/29/2022 0940   MCHC 33.9 11/17/2018 1527   RDW 12.9 06/29/2022 0940   LYMPHSABS 1.6 06/29/2022 0940   EOSABS 0.4 06/29/2022 0940   BASOSABS 0.1 06/29/2022 0940    Lab  Results  Component Value Date   HGBA1C 4.8 06/29/2022    Assessment & Plan:      Hypertension Patient stopped taking antihypertensive medication due to feeling tired and having low blood pressure readings at work. Blood pressure at today's visit was elevated, but patient had consumed coffee and smoked prior to the appointment, which may have affected the reading. -Monitor blood pressure at home and keep a log. -Consider restarting antihypertensive medication if readings consistently above 130/80. -Return in one month for blood pressure reassessment and review of home blood pressure log.  Recurrent Yeast Infections Patient has been successfully managing with post-menstrual Diflucan. -Refill Diflucan prescription at [facility] on [address].  General Health Maintenance -Order basic electrolytes, kidney and liver function tests.          Meds ordered this encounter  Medications   fluconazole (DIFLUCAN) 150 MG tablet    Sig: Take 1 tablet (150 mg total) by mouth once a week. For vaginal candidiasis prevention    Dispense:  12 tablet    Refill:  1    Follow-up: Return in about 1 month (  around 09/16/2023) for Blood Pressure follow-up with PCP.       Hoy Register, MD, FAAFP. [facility] [place], [place] [phone]   08/17/2023, 5:05 PM

## 2023-08-18 LAB — CMP14+EGFR
ALT: 12 [IU]/L (ref 0–32)
AST: 17 [IU]/L (ref 0–40)
Albumin: 4.5 g/dL (ref 3.9–4.9)
Alkaline Phosphatase: 51 [IU]/L (ref 44–121)
BUN/Creatinine Ratio: 14 (ref 9–23)
BUN: 9 mg/dL (ref 6–20)
Bilirubin Total: 0.5 mg/dL (ref 0.0–1.2)
CO2: 23 mmol/L (ref 20–29)
Calcium: 9.7 mg/dL (ref 8.7–10.2)
Chloride: 105 mmol/L (ref 96–106)
Creatinine, Ser: 0.65 mg/dL (ref 0.57–1.00)
Globulin, Total: 2.5 g/dL (ref 1.5–4.5)
Glucose: 91 mg/dL (ref 70–99)
Potassium: 4.4 mmol/L (ref 3.5–5.2)
Sodium: 141 mmol/L (ref 134–144)
Total Protein: 7 g/dL (ref 6.0–8.5)
eGFR: 116 mL/min/{1.73_m2} (ref >59)

## 2024-04-18 ENCOUNTER — Encounter: Payer: Self-pay | Admitting: Internal Medicine

## 2024-04-18 ENCOUNTER — Ambulatory Visit: Attending: Family Medicine | Admitting: Internal Medicine

## 2024-04-18 VITALS — BP 112/75 | HR 75 | Ht 63.0 in | Wt 107.0 lb

## 2024-04-18 DIAGNOSIS — F909 Attention-deficit hyperactivity disorder, unspecified type: Secondary | ICD-10-CM

## 2024-04-18 DIAGNOSIS — B3731 Acute candidiasis of vulva and vagina: Secondary | ICD-10-CM | POA: Diagnosis not present

## 2024-04-18 DIAGNOSIS — F411 Generalized anxiety disorder: Secondary | ICD-10-CM

## 2024-04-18 MED ORDER — SERTRALINE HCL 25 MG PO TABS: 25.0000 mg | ORAL_TABLET | Freq: Every day | ORAL | 0 refills | Status: AC

## 2024-04-18 MED ORDER — SERTRALINE HCL 50 MG PO TABS: 50.0000 mg | ORAL_TABLET | Freq: Every day | ORAL | 2 refills | Status: AC

## 2024-04-18 MED ORDER — FLUCONAZOLE 150 MG PO TABS: 150.0000 mg | ORAL_TABLET | ORAL | 1 refills | Status: AC

## 2024-04-18 NOTE — Progress Notes (Signed)
 Patient ID: Allison Ewing, female    DOB: 1986/09/29  MRN: [id number]  CC: Depression (Depression - requesting to restart zoloft [facility]ne rx for diflucan  )   Subjective: Allison Ewing is a 38 y.o. female who presents for chronic ds management. Her concerns today include:  ADHD, HTN,GAD  Discussed the use of AI scribe software for clinical note transcription with the patient, who gave verbal consent to proceed.  History of Present Illness Allison Ewing is a 38 year old female with generalized anxiety disorder and ADHD who presents with issues of anxiety, depression and concentration difficulties. Her young son is with her  Patient has history of ADHD.  Over the past 6 months, she has been experiencing difficulty with concentration and focus.  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  All feels a bit overwhelming.  She does not know whether she is depressed but states that she feels fatigue and unmotivated.  However she attributes this more to working third shift schedule which affects her sleep pattern.  She feels that the lack of focus/concentration is likely due to anxiety about being back in school.  She was on Zoloft  in the past and felt it worked well for her.  She would like to be restarted on it to see if this will help calm down her symptoms before resorting to medication for ADHD.    She experiences recurrent yeast infections following her menstrual cycles and has been using Diflucan , seeking a refill to continue management.    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    No Known Allergies  Social History   Socioeconomic History   Marital status: Single    Spouse name: Not on file   Number of children: Not on file   Years of education: Not on file   Highest education level: Associate degree: academic program  Occupational History   Not on file  Tobacco Use   Smoking status: Former    Current packs/day: 0.00    Average packs/day: 0.5 packs/day for 10.0 years (5.0 ttl pk-yrs)    Types: Cigarettes, E-cigarettes    Start date: 04/07/2006    Quit date: 04/07/2016    Years since quitting: 8.0   Smokeless tobacco: Never  Vaping Use   Vaping status: Some Days  Substance and Sexual Activity   Alcohol use: Yes    Alcohol/week: 6.0 standard drinks of alcohol    Types: 6 Cans of beer per week    Comment: Weekly   Drug use: Not Currently    Frequency: 4.0 times per week    Types: Cocaine    Comment: 4x cocaine per week   Sexual activity: Yes    Birth control/protection: Pill  Other Topics Concern   Not on file  Social History Narrative   Not on file  Social Drivers of Health   Financial Resource Strain: Medium Risk (04/17/2024)   Overall Financial Resource Strain (CARDIA)    Difficulty of Paying Living Expenses: Somewhat hard  Food Insecurity: No Food Insecurity (04/17/2024)   Hunger Vital Sign    Worried About Running Out of Food in the Last Year: Never true    Ran Out of Food in the Last Year: Never true  Transportation Needs: No Transportation Needs (04/17/2024)   PRAPARE - Administrator, Civil Service (Medical): No    Lack of Transportation (Non-Medical): No  Physical Activity: Sufficiently Active (04/17/2024)   Exercise Vital Sign    Days of Exercise per Week: 3 days     Minutes of Exercise per Session: 50 min  Stress: Stress Concern Present (04/17/2024)   [employer] of Occupational Health - Occupational Stress Questionnaire    Feeling of Stress: To some extent  Social Connections: Moderately Isolated (04/17/2024)   Social Connection and Isolation Panel    Frequency of Communication with Friends and Family: More than three times a week    Frequency of Social Gatherings with Friends and Family: Twice a week    Attends Religious Services: 1 to 4 times per year    Active Member of [employer] or Organizations: No    Attends Engineer, structural: Not on file    Marital Status: Never married  Catering manager Violence: Not on file    Family History  Problem Relation Age of Onset   Diabetes Maternal Aunt    Aneurysm Maternal Grandmother    Cancer Maternal Grandfather        lung   Hypertension Paternal Grandmother    Cancer Paternal Grandmother        cervical   Stroke Paternal Grandmother    Heart disease Paternal Grandmother    Hyperlipidemia Mother    Hypertension Mother    Lung disease Father    Alcohol abuse Father    COPD Father    ADD / ADHD Brother    Anxiety disorder Brother    Diabetes Maternal Aunt    Learning disabilities Brother     Past Surgical History:  Procedure Laterality Date   CESAREAN SECTION N/A 08/06/2014   Procedure: CESAREAN SECTION;  Surgeon: Duwaine Blumenthal, DO;  Location: [facility];  Service: Obstetrics;  Laterality: N/A;   DILATION AND CURETTAGE OF UTERUS  2007   LASER ABLATION OF THE CERVIX     LEEP      ROS: Review of Systems Negative except as stated above  PHYSICAL EXAM: BP 112/75 (BP Location: Left Arm, Patient Position: Sitting, Cuff Size: Normal)   Pulse 75   Ht 5' 3 (1.6 m)   Wt 107 lb (48.5 kg)   SpO2 100%   BMI 18.95 kg/m   Physical Exam  General appearance - alert, well appearing, and in no distress Mental status - normal mood, behavior, speech, dress, motor activity, and thought  processes     04/18/2024   10:48 AM 08/17/2023    3:31 PM 09/25/2022    4:31 PM 06/29/2022    8:59 AM  GAD 7 : Generalized Anxiety Score  Nervous, Anxious, on Edge 2 0 1 0  Control/stop worrying 0 0 0 0  Worry too much - different things 0 0 0 0  Trouble relaxing 0 0 0 0  Restless 3 0 1 0  Easily annoyed or irritable 3 1 1  0  Afraid - awful might happen 0 0 0 0  Total GAD 7 Score 8 1 3  0  Anxiety Difficulty Not difficult at all          04/18/2024   10:48 AM 08/17/2023    3:31 PM 09/25/2022    4:31 PM  Depression screen PHQ 2/9  Decreased Interest 1 0 0  Down, Depressed, Hopeless 0 0 0  PHQ - 2 Score 1 0 0  Altered sleeping 1 0 0  Tired, decreased energy 1 0 1  Change in appetite 0 0 0  Feeling bad or failure about yourself  0 0 0  Trouble concentrating 3 1 1   Moving slowly or fidgety/restless 0 0 0  Suicidal thoughts 0 0 0  PHQ-9 Score 6 1 2   Difficult doing work/chores Somewhat difficult         Latest Ref Rng & Units 08/17/2023    4:12 PM 06/29/2022    9:40 AM 11/17/2018    3:27 PM  CMP  Glucose 70 - 99 mg/dL 91  85  74   BUN 6 - 20 mg/dL 9  13  14    Creatinine 0.57 - 1.00 mg/dL 9.34  9.29  9.23   Sodium 134 - 144 mmol/L 141  138  139   Potassium 3.5 - 5.2 mmol/L 4.4  4.1  3.7   Chloride 96 - 106 mmol/L 105  104  103   CO2 20 - 29 mmol/L 23  22  30    Calcium 8.7 - 10.2 mg/dL 9.7  9.2  9.1   Total Protein 6.0 - 8.5 g/dL 7.0  7.0  7.0   Total Bilirubin 0.0 - 1.2 mg/dL 0.5  0.3  0.4   Alkaline Phos 44 - 121 IU/L 51  49  52   AST 0 - 40 IU/L 17  22  17    ALT 0 - 32 IU/L 12  22  17     Lipid Panel     Component Value Date/Time   CHOL 145 05/16/2016 0000   TRIG 76 05/16/2016 0000   HDL 62 05/16/2016 0000   LDLCALC 83 05/16/2016 0000    CBC    Component Value Date/Time   WBC 5.3 06/29/2022 0940   WBC 4.3 11/17/2018 1527   RBC 4.08 06/29/2022 0940   RBC 4.33 11/17/2018 1527   HGB 12.7 06/29/2022 0940   HCT 37.2 06/29/2022 0940   PLT 265 06/29/2022 0940    MCV 91 06/29/2022 0940   MCH 31.1 06/29/2022 0940   MCH 30.6 07/07/2017 0847   MCHC 34.1 06/29/2022 0940   MCHC 33.9 11/17/2018 1527   RDW 12.9 06/29/2022 0940   LYMPHSABS 1.6 06/29/2022 0940   EOSABS 0.4 06/29/2022 0940   BASOSABS 0.1 06/29/2022 0940    ASSESSMENT AND PLAN:  Assessment and Plan Assessment & Plan Generalized Anxiety Disorder Patient reports increased difficulty with concentration which she feels is due to anxiety.  She would like to get back on Zoloft .  Reports this was helpful in the past.  We will restart Zoloft  25 mg daily for 1 month and then after that increase to 50 mg daily.  2 prescriptions sent to her pharmacy 1 for the 25 mg for 1 month.  Advised to fill this 1 first and take it for the full month before filling for the 50 mg.    ADHD Concentration issues attributed to anxiety. Prioritize anxiety treatment with Zoloft  before addressing ADHD. - Monitor response to Zoloft  for anxiety and concentration. Consider behavioral health referral for ADHD if symptoms persist.  Follow-up with PCP if needed for this.  Vaginal Candidiasis Recurrent yeast infections post-menstrual cycle managed effectively with Diflucan . - Prescribe Diflucan  post-menstrual cycle for prevention. Send prescription to [facility] pharmacy.   Patient was given the opportunity to ask questions.  Patient verbalized understanding of the plan and was able to repeat key elements of the plan.   This documentation was completed using Paediatric nurse.  Any transcriptional errors are unintentional.  No orders of the defined types were placed in this encounter.    Requested Prescriptions   Signed Prescriptions Disp Refills   sertraline  (ZOLOFT ) 25 MG tablet 30 tablet 0    Sig: Take 1 tablet (25 mg total) by mouth daily. Start this dose first and take daily for 1 mth before starting the 50 mg prescription   sertraline  (ZOLOFT ) 50 MG tablet 30 tablet 2    Sig: Take 1 tablet (50 mg  total) by mouth daily.   fluconazole  (DIFLUCAN ) 150 MG tablet 12 tablet 1    Sig: Take 1 tablet (150 mg total) by mouth once a week. For vaginal candidiasis prevention    No follow-ups on file.  Barnie Louder, MD, FACP

## 2024-04-18 NOTE — Patient Instructions (Signed)
 VISIT SUMMARY:  During your visit, we discussed your ongoing issues with anxiety, concentration difficulties, and recurrent yeast infections. We have developed a plan to address each of these concerns to help improve your overall well-being.  YOUR PLAN:  -GENERALIZED ANXIETY DISORDER: Generalized Anxiety Disorder is a condition characterized by excessive worry and anxiety. To help manage your anxiety, we will restart Zoloft  at 25 mg for 3-4 weeks and then increase the dose to 50 mg. Your prescription will be sent to [facility] pharmacy.  -ADHD: Attention Deficit Hyperactivity Disorder (ADHD) is a condition that affects concentration and focus. Since your concentration issues may be related to anxiety, we will first focus on treating your anxiety with Zoloft . If concentration problems persist, we may consider a referral to behavioral health for further evaluation.  -VAGINAL CANDIDIASIS: Vaginal Candidiasis is a yeast infection that can cause itching and discomfort. You have been managing this effectively with Diflucan . We will continue this treatment by prescribing Diflucan  to be taken after your menstrual cycle. Your prescription will be sent to [facility] pharmacy.  INSTRUCTIONS:  Please follow up in 3-4 weeks to assess your response to Zoloft . If you continue to have concentration difficulties or if your symptoms worsen, please contact our office. Additionally, continue using Diflucan  as prescribed after your menstrual cycle to manage yeast infections.

## 2024-05-14 ENCOUNTER — Other Ambulatory Visit: Payer: Self-pay | Admitting: Internal Medicine

## 2024-05-14 DIAGNOSIS — F411 Generalized anxiety disorder: Secondary | ICD-10-CM

## 2024-07-17 ENCOUNTER — Telehealth: Payer: Self-pay | Admitting: Family Medicine

## 2024-07-17 NOTE — Telephone Encounter (Signed)
 2nd attempt contacted pt no mailbox its full sent text message and mychart update to resch appt

## 2024-07-17 NOTE — Telephone Encounter (Signed)
 1st attempt contacted pt (MAILBOX FULL) to leave a message to let the pt know her pcp not in the office

## 2024-07-19 ENCOUNTER — Ambulatory Visit: Admitting: Family Medicine

## 2024-10-31 ENCOUNTER — Ambulatory Visit: Admitting: Family Medicine

## 2024-11-15 ENCOUNTER — Ambulatory Visit: Admitting: Family Medicine

## 2024-12-08 ENCOUNTER — Ambulatory Visit: Admitting: Nurse Practitioner
